# Patient Record
Sex: Female | Born: 1955
Health system: Southern US, Community
[De-identification: ages and names within clinical notes are randomized; demographics above are authoritative.]

## PROBLEM LIST (undated history)

## (undated) DIAGNOSIS — F419 Anxiety disorder, unspecified: Secondary | ICD-10-CM

## (undated) DIAGNOSIS — Z789 Other specified health status: Secondary | ICD-10-CM

## (undated) DIAGNOSIS — K7460 Unspecified cirrhosis of liver: Secondary | ICD-10-CM

## (undated) DIAGNOSIS — F1090 Alcohol use, unspecified, uncomplicated: Secondary | ICD-10-CM

## (undated) DIAGNOSIS — R413 Other amnesia: Secondary | ICD-10-CM

## (undated) DIAGNOSIS — F321 Major depressive disorder, single episode, moderate: Secondary | ICD-10-CM

## (undated) DIAGNOSIS — R202 Paresthesia of skin: Secondary | ICD-10-CM

## (undated) DIAGNOSIS — B182 Chronic viral hepatitis C: Secondary | ICD-10-CM

## (undated) HISTORY — DX: Chronic viral hepatitis C: B18.2

## (undated) HISTORY — DX: Other specified health status: Z78.9

## (undated) HISTORY — DX: Paresthesia of skin: R20.2

## (undated) HISTORY — DX: Other amnesia: R41.3

## (undated) HISTORY — DX: Major depressive disorder, single episode, moderate: F32.1

## (undated) HISTORY — DX: Alcohol use, unspecified, uncomplicated: F10.90

## (undated) HISTORY — DX: Unspecified cirrhosis of liver: K74.60

## (undated) HISTORY — DX: Anxiety disorder, unspecified: F41.9

---

## 2009-07-19 ENCOUNTER — Inpatient Hospital Stay (HOSPITAL_COMMUNITY): Admission: EM | Admit: 2009-07-19 | Discharge: 2009-07-20 | Payer: Self-pay | Admitting: Emergency Medicine

## 2010-09-09 LAB — COMPREHENSIVE METABOLIC PANEL
ALT: 31 U/L (ref 0–35)
AST: 23 U/L (ref 0–37)
Albumin: 3.4 g/dL — ABNORMAL LOW (ref 3.5–5.2)
Alkaline Phosphatase: 53 U/L (ref 39–117)
BUN: 7 mg/dL (ref 6–23)
CO2: 23 mEq/L (ref 19–32)
Calcium: 8.6 mg/dL (ref 8.4–10.5)
Chloride: 108 mEq/L (ref 96–112)
Creatinine, Ser: 0.6 mg/dL (ref 0.4–1.2)
GFR calc Af Amer: >60 mL/min (ref >60)
GFR calc non Af Amer: >60 mL/min (ref >60)
Glucose, Bld: 111 mg/dL — ABNORMAL HIGH (ref 70–99)
Potassium: 3.9 mEq/L (ref 3.5–5.1)
Sodium: 140 mEq/L (ref 135–145)
Total Bilirubin: 0.7 mg/dL (ref 0.3–1.2)
Total Protein: 6 g/dL (ref 6.0–8.3)

## 2010-09-09 LAB — CBC
HCT: 36.6 % (ref 36.0–46.0)
HCT: 40 % (ref 36.0–46.0)
Hemoglobin: 12.2 g/dL (ref 12.0–15.0)
Hemoglobin: 13.4 g/dL (ref 12.0–15.0)
MCHC: 33.4 g/dL (ref 30.0–36.0)
MCHC: 33.4 g/dL (ref 30.0–36.0)
MCV: 97.3 fL (ref 78.0–100.0)
MCV: 97.9 fL (ref 78.0–100.0)
Platelets: 137 10*3/uL — ABNORMAL LOW (ref 150–400)
Platelets: 163 10*3/uL (ref 150–400)
RBC: 3.74 MIL/uL — ABNORMAL LOW (ref 3.87–5.11)
RBC: 4.12 MIL/uL (ref 3.87–5.11)
RDW: 12 % (ref 11.5–15.5)
RDW: 12.2 % (ref 11.5–15.5)
WBC: 5.7 10*3/uL (ref 4.0–10.5)
WBC: 6.9 10*3/uL (ref 4.0–10.5)

## 2010-09-09 LAB — DIFFERENTIAL
Basophils Absolute: 0 10*3/uL (ref 0.0–0.1)
Basophils Relative: 0 % (ref 0–1)
Eosinophils Absolute: 0 10*3/uL (ref 0.0–0.7)
Eosinophils Relative: 0 % (ref 0–5)
Lymphocytes Relative: 14 % (ref 12–46)
Lymphs Abs: 0.8 10*3/uL (ref 0.7–4.0)
Monocytes Absolute: 0.1 10*3/uL (ref 0.1–1.0)
Monocytes Relative: 2 % — ABNORMAL LOW (ref 3–12)
Neutro Abs: 4.7 10*3/uL (ref 1.7–7.7)
Neutrophils Relative %: 84 % — ABNORMAL HIGH (ref 43–77)

## 2010-09-09 LAB — RPR: RPR Ser Ql: NONREACTIVE

## 2010-09-09 LAB — CSF CELL COUNT WITH DIFFERENTIAL
Lymphs, CSF: 70 % (ref 40–80)
Lymphs, CSF: 71 % (ref 40–80)
Monocyte-Macrophage-Spinal Fluid: 12 % — ABNORMAL LOW (ref 15–45)
Monocyte-Macrophage-Spinal Fluid: 6 % — ABNORMAL LOW (ref 15–45)
RBC Count, CSF: 49 /mm3 — ABNORMAL HIGH
RBC Count, CSF: 7 /mm3 — ABNORMAL HIGH
Segmented Neutrophils-CSF: 18 % — ABNORMAL HIGH (ref 0–6)
Segmented Neutrophils-CSF: 23 % — ABNORMAL HIGH (ref 0–6)
Tube #: 1
Tube #: 3
WBC, CSF: 34 /mm3 (ref 0–5)
WBC, CSF: 93 /mm3 (ref 0–5)

## 2010-09-09 LAB — GRAM STAIN

## 2010-09-09 LAB — CSF CULTURE W GRAM STAIN: Culture: NO GROWTH

## 2010-09-09 LAB — HIV ANTIBODY (ROUTINE TESTING W REFLEX): HIV: NONREACTIVE

## 2010-09-09 LAB — POCT I-STAT, CHEM 8
BUN: 7 mg/dL (ref 6–23)
Calcium, Ion: 0.99 mmol/L — ABNORMAL LOW (ref 1.12–1.32)
Chloride: 102 mEq/L (ref 96–112)
Creatinine, Ser: 0.8 mg/dL (ref 0.4–1.2)
Glucose, Bld: 105 mg/dL — ABNORMAL HIGH (ref 70–99)
HCT: 47 % — ABNORMAL HIGH (ref 36.0–46.0)
Hemoglobin: 16 g/dL — ABNORMAL HIGH (ref 12.0–15.0)
Potassium: 3.9 mEq/L (ref 3.5–5.1)
Sodium: 132 mEq/L — ABNORMAL LOW (ref 135–145)
TCO2: 21 mmol/L (ref 0–100)

## 2010-09-09 LAB — BETA-HYDROXYBUTYRIC ACID

## 2010-09-09 LAB — PROTEIN AND GLUCOSE, CSF
Glucose, CSF: 55 mg/dL (ref 43–76)
Total  Protein, CSF: 132 mg/dL — ABNORMAL HIGH (ref 15–45)

## 2010-09-09 LAB — GLUCOSE, CAPILLARY: Glucose-Capillary: 105 mg/dL — ABNORMAL HIGH (ref 70–99)

## 2010-09-09 LAB — PHOSPHORUS: Phosphorus: 4.5 mg/dL (ref 2.3–4.6)

## 2012-12-06 ENCOUNTER — Encounter: Payer: Self-pay | Admitting: Obstetrics

## 2012-12-06 ENCOUNTER — Ambulatory Visit (INDEPENDENT_AMBULATORY_CARE_PROVIDER_SITE_OTHER): Payer: No Typology Code available for payment source | Admitting: Obstetrics

## 2012-12-06 VITALS — BP 165/89 | HR 98 | Temp 98.7°F | Ht 67.0 in | Wt 137.0 lb

## 2012-12-06 DIAGNOSIS — Z01419 Encounter for gynecological examination (general) (routine) without abnormal findings: Secondary | ICD-10-CM

## 2012-12-06 DIAGNOSIS — N949 Unspecified condition associated with female genital organs and menstrual cycle: Secondary | ICD-10-CM | POA: Insufficient documentation

## 2012-12-06 LAB — DIFFERENTIAL
Basophils Absolute: 0 10*3/uL (ref 0.0–0.1)
Basophils Relative: 0 % (ref 0–1)
Eosinophils Absolute: 0.1 10*3/uL (ref 0.0–0.7)
Eosinophils Relative: 2 % (ref 0–5)
Lymphocytes Relative: 26 % (ref 12–46)
Lymphs Abs: 1.6 10*3/uL (ref 0.7–4.0)
Monocytes Absolute: 0.7 10*3/uL (ref 0.1–1.0)
Monocytes Relative: 12 % (ref 3–12)
Neutro Abs: 3.7 10*3/uL (ref 1.7–7.7)
Neutrophils Relative %: 60 % (ref 43–77)

## 2012-12-06 LAB — COMPREHENSIVE METABOLIC PANEL
ALT: 102 U/L — ABNORMAL HIGH (ref 0–35)
AST: 74 U/L — ABNORMAL HIGH (ref 0–37)
Albumin: 4.1 g/dL (ref 3.5–5.2)
Alkaline Phosphatase: 74 U/L (ref 39–117)
BUN: 4 mg/dL — ABNORMAL LOW (ref 6–23)
CO2: 25 mEq/L (ref 19–32)
Calcium: 9.1 mg/dL (ref 8.4–10.5)
Chloride: 103 mEq/L (ref 96–112)
Creat: 0.55 mg/dL (ref 0.50–1.10)
Glucose, Bld: 98 mg/dL (ref 70–99)
Potassium: 4 mEq/L (ref 3.5–5.3)
Sodium: 136 mEq/L (ref 135–145)
Total Bilirubin: 0.9 mg/dL (ref 0.3–1.2)
Total Protein: 6.8 g/dL (ref 6.0–8.3)

## 2012-12-06 LAB — CBC
HCT: 40 % (ref 36.0–46.0)
Hemoglobin: 14.5 g/dL (ref 12.0–15.0)
MCH: 33.6 pg (ref 26.0–34.0)
MCHC: 36.3 g/dL — ABNORMAL HIGH (ref 30.0–36.0)
MCV: 92.8 fL (ref 78.0–100.0)
Platelets: 171 10*3/uL (ref 150–400)
RBC: 4.31 MIL/uL (ref 3.87–5.11)
RDW: 13 % (ref 11.5–15.5)
WBC: 6.2 10*3/uL (ref 4.0–10.5)

## 2012-12-06 LAB — TSH: TSH: 2.843 u[IU]/mL (ref 0.350–4.500)

## 2012-12-06 NOTE — Addendum Note (Signed)
Addended by: Julaine Hua on: 12/06/2012 05:49 PM   Modules accepted: Orders

## 2012-12-06 NOTE — Patient Instructions (Addendum)
Causes of pelvic pain, and management.

## 2012-12-06 NOTE — Progress Notes (Signed)
Subjective:     Regina Smith is a 57 y.o. female here for a routine exam.  Current complaints: problem visit and annual exam. Pt states she started having pelvic pain about two days ago and it has moved around to her back.  Has had H/O constipation and did feel better after taking a laxative.  Pt states constant dull ache and pressure. Pt states she has been resting. Pt states she has tried aspirin and it did help to relieve the pain.   Personal health questionnaire reviewed: yes.  Has been postmenopausal for ~ 10 years.   Gynecologic History No LMP recorded. Patient is postmenopausal. Contraception: abstinence Last Pap: 2005. Results were: normal Last mammogram: n/a. Results were: n/a  Obstetric History OB History   Grav Para Term Preterm Abortions TAB SAB Ect Mult Living                   The following portions of the patient's history were reviewed and updated as appropriate: allergies, current medications, past family history, past medical history, past social history, past surgical history and problem list.  Review of Systems Pertinent items are noted in HPI.    Objective:    General appearance: alert and no distress Breasts: normal appearance, no masses or tenderness Abdomen: normal findings: soft, non-tender Pelvic: cervix normal in appearance, external genitalia normal, no cervical motion tenderness, positive findings: tenderness of L>R.  No masses. adnexa(e), uterus normal size, shape, and consistency and vagina normal without discharge    Assessment:    Healthy female exam.   Pelvic pain.  R/O IBS.   Plan:    Mammogram ordered. Follow up in: 2 weeks. Ultrasound ordered.

## 2012-12-07 ENCOUNTER — Ambulatory Visit (INDEPENDENT_AMBULATORY_CARE_PROVIDER_SITE_OTHER): Payer: No Typology Code available for payment source

## 2012-12-07 ENCOUNTER — Other Ambulatory Visit: Payer: Self-pay | Admitting: Obstetrics

## 2012-12-07 DIAGNOSIS — D259 Leiomyoma of uterus, unspecified: Secondary | ICD-10-CM

## 2012-12-07 DIAGNOSIS — N949 Unspecified condition associated with female genital organs and menstrual cycle: Secondary | ICD-10-CM

## 2012-12-07 DIAGNOSIS — N83209 Unspecified ovarian cyst, unspecified side: Secondary | ICD-10-CM

## 2012-12-08 LAB — PAP IG W/ RFLX HPV ASCU

## 2012-12-13 ENCOUNTER — Encounter: Payer: Self-pay | Admitting: Obstetrics

## 2012-12-21 ENCOUNTER — Ambulatory Visit (INDEPENDENT_AMBULATORY_CARE_PROVIDER_SITE_OTHER): Payer: No Typology Code available for payment source | Admitting: Obstetrics

## 2012-12-21 ENCOUNTER — Encounter: Payer: Self-pay | Admitting: Obstetrics

## 2012-12-21 ENCOUNTER — Encounter: Payer: Self-pay | Admitting: Gastroenterology

## 2012-12-21 VITALS — BP 144/88 | HR 75 | Temp 98.4°F | Ht 67.0 in | Wt 136.0 lb

## 2012-12-21 DIAGNOSIS — N949 Unspecified condition associated with female genital organs and menstrual cycle: Secondary | ICD-10-CM

## 2012-12-21 DIAGNOSIS — R7989 Other specified abnormal findings of blood chemistry: Secondary | ICD-10-CM

## 2012-12-21 NOTE — Progress Notes (Signed)
.   Subjective:     Regina Smith is a 57 y.o. female here for her results - she had an ultrasound done on 6.17/14 and blood work done at her last visit.  No current complaints.  Personal health questionnaire reviewed: yes.   Gynecologic History No LMP recorded. Patient is postmenopausal. Contraception: none  Obstetric History OB History   Grav Para Term Preterm Abortions TAB SAB Ect Mult Living   1 1        1      # Outc Date GA Lbr Len/2nd Wgt Sex Del Anes PTL Lv   1 PAR                The following portions of the patient's history were reviewed and updated as appropriate: allergies, current medications, past family history, past medical history, past social history, past surgical history and problem list.  Review of Systems Pertinent items are noted in HPI.    Objective:    No exam performed today, Consult only, for results..    Assessment:    Pelvic pain.  Resolved.  Ultrasound normal.  Elevated LFT's.   Plan:    Follow up in: several months. GI referral for elevated LFT's.

## 2012-12-27 ENCOUNTER — Ambulatory Visit: Payer: No Typology Code available for payment source | Admitting: Gastroenterology

## 2012-12-30 ENCOUNTER — Ambulatory Visit (HOSPITAL_COMMUNITY): Payer: No Typology Code available for payment source

## 2014-04-24 ENCOUNTER — Encounter: Payer: Self-pay | Admitting: Obstetrics

## 2015-06-14 DIAGNOSIS — R51 Headache: Secondary | ICD-10-CM

## 2015-06-14 DIAGNOSIS — R519 Headache, unspecified: Secondary | ICD-10-CM | POA: Insufficient documentation

## 2015-06-14 DIAGNOSIS — R059 Cough, unspecified: Secondary | ICD-10-CM | POA: Insufficient documentation

## 2015-06-14 DIAGNOSIS — G5600 Carpal tunnel syndrome, unspecified upper limb: Secondary | ICD-10-CM | POA: Insufficient documentation

## 2015-06-14 DIAGNOSIS — M62838 Other muscle spasm: Secondary | ICD-10-CM | POA: Insufficient documentation

## 2015-06-14 DIAGNOSIS — M4802 Spinal stenosis, cervical region: Secondary | ICD-10-CM | POA: Insufficient documentation

## 2015-06-14 DIAGNOSIS — F5101 Primary insomnia: Secondary | ICD-10-CM | POA: Insufficient documentation

## 2015-06-14 DIAGNOSIS — R5383 Other fatigue: Secondary | ICD-10-CM | POA: Insufficient documentation

## 2015-06-14 DIAGNOSIS — R05 Cough: Secondary | ICD-10-CM | POA: Insufficient documentation

## 2015-06-20 DIAGNOSIS — R7309 Other abnormal glucose: Secondary | ICD-10-CM | POA: Insufficient documentation

## 2015-07-09 DIAGNOSIS — F331 Major depressive disorder, recurrent, moderate: Secondary | ICD-10-CM | POA: Insufficient documentation

## 2018-05-31 DIAGNOSIS — L7211 Pilar cyst: Secondary | ICD-10-CM | POA: Diagnosis not present

## 2018-05-31 DIAGNOSIS — F329 Major depressive disorder, single episode, unspecified: Secondary | ICD-10-CM | POA: Diagnosis not present

## 2018-05-31 DIAGNOSIS — N390 Urinary tract infection, site not specified: Secondary | ICD-10-CM | POA: Diagnosis not present

## 2018-06-21 DIAGNOSIS — L7211 Pilar cyst: Secondary | ICD-10-CM | POA: Diagnosis not present

## 2018-08-18 ENCOUNTER — Encounter: Payer: Self-pay | Admitting: Obstetrics and Gynecology

## 2018-08-26 ENCOUNTER — Other Ambulatory Visit (HOSPITAL_COMMUNITY)
Admission: RE | Admit: 2018-08-26 | Discharge: 2018-08-26 | Disposition: A | Discharge status: Home / Self Care | Payer: BLUE CROSS/BLUE SHIELD | Source: Ambulatory Visit | Attending: Obstetrics and Gynecology | Admitting: Obstetrics and Gynecology

## 2018-08-26 ENCOUNTER — Other Ambulatory Visit: Payer: Self-pay

## 2018-08-26 ENCOUNTER — Ambulatory Visit (INDEPENDENT_AMBULATORY_CARE_PROVIDER_SITE_OTHER): Payer: BLUE CROSS/BLUE SHIELD | Admitting: Obstetrics and Gynecology

## 2018-08-26 ENCOUNTER — Encounter: Payer: Self-pay | Admitting: Obstetrics and Gynecology

## 2018-08-26 VITALS — BP 140/88 | HR 84 | Ht 65.75 in | Wt 127.4 lb

## 2018-08-26 DIAGNOSIS — R945 Abnormal results of liver function studies: Secondary | ICD-10-CM

## 2018-08-26 DIAGNOSIS — Z0000 Encounter for general adult medical examination without abnormal findings: Secondary | ICD-10-CM | POA: Diagnosis not present

## 2018-08-26 DIAGNOSIS — Z01419 Encounter for gynecological examination (general) (routine) without abnormal findings: Secondary | ICD-10-CM

## 2018-08-26 DIAGNOSIS — Z113 Encounter for screening for infections with a predominantly sexual mode of transmission: Secondary | ICD-10-CM | POA: Insufficient documentation

## 2018-08-26 DIAGNOSIS — E2839 Other primary ovarian failure: Secondary | ICD-10-CM

## 2018-08-26 DIAGNOSIS — R7989 Other specified abnormal findings of blood chemistry: Secondary | ICD-10-CM

## 2018-08-26 DIAGNOSIS — Z13820 Encounter for screening for osteoporosis: Secondary | ICD-10-CM

## 2018-08-26 DIAGNOSIS — N898 Other specified noninflammatory disorders of vagina: Secondary | ICD-10-CM | POA: Diagnosis not present

## 2018-08-26 DIAGNOSIS — Z124 Encounter for screening for malignant neoplasm of cervix: Secondary | ICD-10-CM

## 2018-08-26 DIAGNOSIS — F17200 Nicotine dependence, unspecified, uncomplicated: Secondary | ICD-10-CM

## 2018-08-26 NOTE — Patient Instructions (Signed)
Check on coverage for colonoscopy and the cologuard.  EXERCISE AND DIET:  We recommended that you start or continue a regular exercise program for good health. Regular exercise means any activity that makes your heart beat faster and makes you sweat.  We recommend exercising at least 30 minutes per day at least 3 days a week, preferably 4 or 5.  We also recommend a diet low in fat and sugar.  Inactivity, poor dietary choices and obesity can cause diabetes, heart attack, stroke, and kidney damage, among others.    ALCOHOL AND SMOKING:  Women should limit their alcohol intake to no more than 7 drinks/beers/glasses of wine (combined, not each!) per week. Moderation of alcohol intake to this level decreases your risk of breast cancer and liver damage. And of course, no recreational drugs are part of a healthy lifestyle.  And absolutely no smoking or even second hand smoke. Most people know smoking can cause heart and lung diseases, but did you know it also contributes to weakening of your bones? Aging of your skin?  Yellowing of your teeth and nails?  CALCIUM AND VITAMIN D:  Adequate intake of calcium and Vitamin D are recommended.  The recommendations for exact amounts of these supplements seem to change often, but generally speaking 1,200 mg of calcium (between diet and supplement) and 800 units of Vitamin D per day seems prudent. Certain women may benefit from higher intake of Vitamin D.  If you are among these women, your doctor will have told you during your visit.    PAP SMEARS:  Pap smears, to check for cervical cancer or precancers,  have traditionally been done yearly, although recent scientific advances have shown that most women can have pap smears less often.  However, every woman still should have a physical exam from her gynecologist every year. It will include a breast check, inspection of the vulva and vagina to check for abnormal growths or skin changes, a visual exam of the cervix, and then an  exam to evaluate the size and shape of the uterus and ovaries.  And after 63 years of age, a rectal exam is indicated to check for rectal cancers. We will also provide age appropriate advice regarding health maintenance, like when you should have certain vaccines, screening for sexually transmitted diseases, bone density testing, colonoscopy, mammograms, etc.   MAMMOGRAMS:  All women over 13 years old should have a yearly mammogram. Many facilities now offer a "3D" mammogram, which may cost around $50 extra out of pocket. If possible,  we recommend you accept the option to have the 3D mammogram performed.  It both reduces the number of women who will be called back for extra views which then turn out to be normal, and it is better than the routine mammogram at detecting truly abnormal areas.    COLON CANCER SCREENING: Now recommend starting at age 63. At this time colonoscopy is not covered for routine screening until 50. There are take home tests that can be done between 45-49.   COLONOSCOPY:  Colonoscopy to screen for colon cancer is recommended for all women at age 63.  We know, you hate the idea of the prep.  We agree, BUT, having colon cancer and not knowing it is worse!!  Colon cancer so often starts as a polyp that can be seen and removed at colonscopy, which can quite literally save your life!  And if your first colonoscopy is normal and you have no family history of colon cancer,  most women don't have to have it again for 10 years.  Once every ten years, you can do something that may end up saving your life, right?  We will be happy to help you get it scheduled when you are ready.  Be sure to check your insurance coverage so you understand how much it will cost.  It may be covered as a preventative service at no cost, but you should check your particular policy.      Breast Self-Awareness Breast self-awareness means being familiar with how your breasts look and feel. It involves checking your  breasts regularly and reporting any changes to your health care provider. Practicing breast self-awareness is important. A change in your breasts can be a sign of a serious medical problem. Being familiar with how your breasts look and feel allows you to find any problems early, when treatment is more likely to be successful. All women should practice breast self-awareness, including women who have had breast implants. How to do a breast self-exam One way to learn what is normal for your breasts and whether your breasts are changing is to do a breast self-exam. To do a breast self-exam: Look for Changes  1. Remove all the clothing above your waist. 2. Stand in front of a mirror in a room with good lighting. 3. Put your hands on your hips. 4. Push your hands firmly downward. 5. Compare your breasts in the mirror. Look for differences between them (asymmetry), such as: ? Differences in shape. ? Differences in size. ? Puckers, dips, and bumps in one breast and not the other. 6. Look at each breast for changes in your skin, such as: ? Redness. ? Scaly areas. 7. Look for changes in your nipples, such as: ? Discharge. ? Bleeding. ? Dimpling. ? Redness. ? A change in position. Feel for Changes Carefully feel your breasts for lumps and changes. It is best to do this while lying on your back on the floor and again while sitting or standing in the shower or tub with soapy water on your skin. Feel each breast in the following way:  Place the arm on the side of the breast you are examining above your head.  Feel your breast with the other hand.  Start in the nipple area and make  inch (2 cm) overlapping circles to feel your breast. Use the pads of your three middle fingers to do this. Apply light pressure, then medium pressure, then firm pressure. The light pressure will allow you to feel the tissue closest to the skin. The medium pressure will allow you to feel the tissue that is a little deeper.  The firm pressure will allow you to feel the tissue close to the ribs.  Continue the overlapping circles, moving downward over the breast until you feel your ribs below your breast.  Move one finger-width toward the center of the body. Continue to use the  inch (2 cm) overlapping circles to feel your breast as you move slowly up toward your collarbone.  Continue the up and down exam using all three pressures until you reach your armpit.  Write Down What You Find  Write down what is normal for each breast and any changes that you find. Keep a written record with breast changes or normal findings for each breast. By writing this information down, you do not need to depend only on memory for size, tenderness, or location. Write down where you are in your menstrual cycle, if you are  still menstruating. If you are having trouble noticing differences in your breasts, do not get discouraged. With time you will become more familiar with the variations in your breasts and more comfortable with the exam. How often should I examine my breasts? Examine your breasts every month. If you are breastfeeding, the best time to examine your breasts is after a feeding or after using a breast pump. If you menstruate, the best time to examine your breasts is 5-7 days after your period is over. During your period, your breasts are lumpier, and it may be more difficult to notice changes. When should I see my health care provider? See your health care provider if you notice:  A change in shape or size of your breasts or nipples.  A change in the skin of your breast or nipples, such as a reddened or scaly area.  Unusual discharge from your nipples.  A lump or thick area that was not there before.  Pain in your breasts.  Anything that concerns you.

## 2018-08-26 NOTE — Progress Notes (Signed)
63 y.o. G1P1 Single White or Caucasian Not Hispanic or Latino female here for annual exam.   She had sex in 1/19 after years without sex. Since then she has a vaginal odor. She has vaginal dryness. Not currently sexually active.  No vaginal bleeding. No bowel or bladder c/o.    No LMP recorded. Patient is postmenopausal.          Sexually active: No.  The current method of family planning is post menopausal status.    Exercising: Yes.    treadmill, floor exercises Smoker:  Yes, 5 cigarettes a day  Health Maintenance: Pap:  Unsure History of abnormal Pap:  Yes, cyrosurgery for dysplasia MMG:  Never BMD:   Never Colonoscopy: Never TDaP:  2009 Gardasil: N/A   reports that she has been smoking cigarettes. She has a 20.00 pack-year smoking history. She has never used smokeless tobacco. She reports current alcohol use of about 5.0 standard drinks of alcohol per week. She reports that she does not use drugs. 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 Grown daughter and 2 grandsons 5 and 12  History reviewed. No pertinent past medical history.  History reviewed. No pertinent surgical history.  Current Outpatient Medications  Medication Sig Dispense Refill  . buPROPion (WELLBUTRIN SR) 150 MG 12 hr tablet Take 150 mg by mouth 2 (two) times daily.    . TraZODone HCl 150 MG TB24 Take 1 tablet by mouth.     No current facility-administered medications for this visit.     Family History  Problem Relation Age of Onset  . Alzheimer's disease Mother   . Heart attack Father   Mom and sister with osteoporosis  Review of Systems  Constitutional: Negative.   HENT: Negative.   Eyes: Negative.   Respiratory: Negative.   Cardiovascular: Negative.   Gastrointestinal: Negative.   Endocrine: Negative.   Genitourinary: Positive for dyspareunia.       Vaginal odor  Musculoskeletal: Negative.   Skin: Negative.   Allergic/Immunologic: Negative.   Neurological: Negative.    Hematological: Negative.   Psychiatric/Behavioral: Negative.     Exam:   BP (!) 148/88 (BP Location: Right Arm, Patient Position: Sitting, Cuff Size: Normal)   Pulse 84   Ht 5' 5.75" (1.67 m)   Wt 127 lb 6.4 oz (57.8 kg)   BMI 20.72 kg/m   Weight change: @WEIGHTCHANGE @ Height:   Height: 5' 5.75" (167 cm)  Ht Readings from Last 3 Encounters:  08/26/18 5' 5.75" (1.67 m)  12/21/12 5\' 7"  (1.702 m)  12/06/12 5\' 7"  (1.702 m)    General appearance: alert, cooperative and appears stated age Head: Normocephalic, without obvious abnormality, atraumatic Neck: no adenopathy, supple, symmetrical, trachea midline and thyroid normal to inspection and palpation Lungs: clear to auscultation bilaterally Cardiovascular: regular rate and rhythm Breasts: normal appearance, no masses or tenderness Abdomen: soft, non-tender; non distended,  no masses,  liver edge is palpated 7 cm below liver edge Extremities: extremities normal, atraumatic, no cyanosis or edema Skin: Skin color, texture, turgor normal. No rashes or lesions Lymph nodes: Cervical, supraclavicular, and axillary nodes normal. No abnormal inguinal nodes palpated Neurologic: Grossly normal   Pelvic: External genitalia:  no lesions              Urethra:  normal appearing urethra with no masses, tenderness or lesions              Bartholins and Skenes: normal  Vagina: mildly atrophic appearing vagina with normal color and discharge, no lesions              Cervix: no lesions               Bimanual Exam:  Uterus:  normal size, contour, position, consistency, mobility, non-tender              Adnexa: no mass, fullness, tenderness               Rectovaginal: Confirms               Anus:  normal sphincter tone, no lesions  Chaperone was present for exam.  A:  Well Woman with normal exam  H/O elevated LFT's, thinks she tested + for hepatitis C  Elevated BP without diagnosis of HTN repeat 140/82  Vaginal odor  P:   Pap  with hpv, STD's  Screening labs  Screening hep b and c  STD screening  Affirm  Mammogram discussed, recommended, she will consider  Colonoscopy ordered  Discussed breast self exam  Discussed c alcium and vit D intake  F/U for bp check in a couple of weeks  DEXA (smoker, menopause)

## 2018-08-27 ENCOUNTER — Telehealth: Payer: Self-pay | Admitting: Emergency Medicine

## 2018-08-27 DIAGNOSIS — Z7689 Persons encountering health services in other specified circumstances: Secondary | ICD-10-CM

## 2018-08-27 LAB — LIPID PANEL
Chol/HDL Ratio: 2.4 ratio (ref 0.0–4.4)
Cholesterol, Total: 194 mg/dL (ref 100–199)
HDL: 80 mg/dL (ref >39)
LDL Calculated: 101 mg/dL — ABNORMAL HIGH (ref 0–99)
Triglycerides: 63 mg/dL (ref 0–149)
VLDL Cholesterol Cal: 13 mg/dL (ref 5–40)

## 2018-08-27 LAB — COMPREHENSIVE METABOLIC PANEL
ALT: 54 IU/L — ABNORMAL HIGH (ref 0–32)
AST: 55 IU/L — ABNORMAL HIGH (ref 0–40)
Albumin/Globulin Ratio: 1.8 (ref 1.2–2.2)
Albumin: 4.3 g/dL (ref 3.8–4.8)
Alkaline Phosphatase: 71 IU/L (ref 39–117)
BUN/Creatinine Ratio: 9 — ABNORMAL LOW (ref 12–28)
BUN: 5 mg/dL — ABNORMAL LOW (ref 8–27)
Bilirubin Total: 0.4 mg/dL (ref 0.0–1.2)
CO2: 21 mmol/L (ref 20–29)
Calcium: 9 mg/dL (ref 8.7–10.3)
Chloride: 104 mmol/L (ref 96–106)
Creatinine, Ser: 0.58 mg/dL (ref 0.57–1.00)
GFR calc Af Amer: 114 mL/min/{1.73_m2} (ref >59)
GFR calc non Af Amer: 99 mL/min/{1.73_m2} (ref >59)
Globulin, Total: 2.4 g/dL (ref 1.5–4.5)
Glucose: 97 mg/dL (ref 65–99)
Potassium: 3.9 mmol/L (ref 3.5–5.2)
Sodium: 140 mmol/L (ref 134–144)
Total Protein: 6.7 g/dL (ref 6.0–8.5)

## 2018-08-27 LAB — VAGINITIS/VAGINOSIS, DNA PROBE
Candida Species: NEGATIVE
Gardnerella vaginalis: NEGATIVE
Trichomonas vaginosis: NEGATIVE

## 2018-08-27 LAB — CBC
Hematocrit: 40.9 % (ref 34.0–46.6)
Hemoglobin: 14.1 g/dL (ref 11.1–15.9)
MCH: 32.1 pg (ref 26.6–33.0)
MCHC: 34.5 g/dL (ref 31.5–35.7)
MCV: 93 fL (ref 79–97)
Platelets: 117 10*3/uL — ABNORMAL LOW (ref 150–450)
RBC: 4.39 x10E6/uL (ref 3.77–5.28)
RDW: 12.7 % (ref 11.7–15.4)
WBC: 4.1 10*3/uL (ref 3.4–10.8)

## 2018-08-27 LAB — RPR: RPR Ser Ql: NONREACTIVE

## 2018-08-27 LAB — HEPATITIS C ANTIBODY: Hep C Virus Ab: >11 s/co ratio — ABNORMAL HIGH (ref 0.0–0.9)

## 2018-08-27 LAB — HEPATITIS B SURFACE ANTIGEN: Hepatitis B Surface Ag: NEGATIVE

## 2018-08-27 LAB — HIV ANTIBODY (ROUTINE TESTING W REFLEX): HIV Screen 4th Generation wRfx: NONREACTIVE

## 2018-08-27 NOTE — Telephone Encounter (Signed)
Patient returning call.

## 2018-08-27 NOTE — Telephone Encounter (Signed)
-----   Message from Salvadore Dom, MD sent at 08/27/2018 12:58 PM EST ----- Lorre Nick: Please add a HCV Nucleic Acid Amplification test to her blood work  Olivia Mackie: Please let the patient know that her Hep C AB test is + and we are adding further testing to see if she has an active Hep C infection, her LFT's are mildly elevated, her platelets are mildly low. If her follow up testing is + we need to send her to the hepatologist who specializes in Hep C. If not she needs a primary MD

## 2018-08-27 NOTE — Telephone Encounter (Signed)
Message left to return call to Quavon Keisling at [phone].    

## 2018-08-27 NOTE — Telephone Encounter (Signed)
Spoke with patient.  Message from Dr. Talbert Nan given.  Verbalizes understading.  Would like referral to PCP. Referral entered to [facility].  Will call back with final results and possible referral to Hep C clinic at [facility].

## 2018-08-31 LAB — CYTOLOGY - PAP
Chlamydia: NEGATIVE
Diagnosis: NEGATIVE
HPV: NOT DETECTED
Neisseria Gonorrhea: NEGATIVE

## 2018-09-02 LAB — HEPATITIS C VRS RNA DETECT BY PCR-QUAL: HCV RNA NAA Qualitative: POSITIVE — AB

## 2018-09-02 LAB — SPECIMEN STATUS REPORT

## 2018-09-03 ENCOUNTER — Telehealth: Payer: Self-pay | Admitting: Emergency Medicine

## 2018-09-03 DIAGNOSIS — R768 Other specified abnormal immunological findings in serum: Secondary | ICD-10-CM

## 2018-09-03 DIAGNOSIS — R7989 Other specified abnormal findings of blood chemistry: Secondary | ICD-10-CM

## 2018-09-03 DIAGNOSIS — R945 Abnormal results of liver function studies: Principal | ICD-10-CM

## 2018-09-03 NOTE — Telephone Encounter (Signed)
Notified patient of results and referral to HEP C clinic at [facility].  Questions answered and patient also has PCP appointment to establish care with Dr. Rogers Blocker.  Encounter closed.

## 2018-09-03 NOTE — Telephone Encounter (Signed)
-----   Message from Salvadore Dom, MD sent at 09/02/2018  5:14 PM EDT ----- Please inform and refer to hepatitis clinic

## 2018-09-17 ENCOUNTER — Ambulatory Visit: Payer: No Typology Code available for payment source | Admitting: Family Medicine

## 2018-10-19 ENCOUNTER — Encounter: Payer: No Typology Code available for payment source | Admitting: Infectious Diseases

## 2018-10-26 ENCOUNTER — Encounter: Payer: No Typology Code available for payment source | Admitting: Infectious Diseases

## 2018-11-26 ENCOUNTER — Encounter: Payer: No Typology Code available for payment source | Admitting: Infectious Diseases

## 2019-06-24 ENCOUNTER — Emergency Department (HOSPITAL_COMMUNITY)
Admission: EM | Admit: 2019-06-24 | Discharge: 2019-06-24 | Disposition: A | Discharge status: Home / Self Care | Payer: 59 | Attending: Emergency Medicine | Admitting: Emergency Medicine

## 2019-06-24 ENCOUNTER — Emergency Department (HOSPITAL_COMMUNITY): Payer: 59

## 2019-06-24 ENCOUNTER — Encounter (HOSPITAL_COMMUNITY): Payer: Self-pay | Admitting: Emergency Medicine

## 2019-06-24 DIAGNOSIS — F17210 Nicotine dependence, cigarettes, uncomplicated: Secondary | ICD-10-CM | POA: Diagnosis not present

## 2019-06-24 DIAGNOSIS — M79671 Pain in right foot: Secondary | ICD-10-CM | POA: Diagnosis present

## 2019-06-24 DIAGNOSIS — M84374A Stress fracture, right foot, initial encounter for fracture: Secondary | ICD-10-CM | POA: Insufficient documentation

## 2019-06-24 MED ORDER — HYDROCODONE-ACETAMINOPHEN 5-325 MG PO TABS
1.0000 | ORAL_TABLET | Freq: Once | ORAL | Status: AC
  Administered 2019-06-24: 1 via ORAL
  Filled 2019-06-24: qty 1

## 2019-06-24 MED ORDER — HYDROCODONE-ACETAMINOPHEN 5-325 MG PO TABS: 1.0000 | ORAL_TABLET | Freq: Three times a day (TID) | ORAL | 0 refills | Status: AC | PRN

## 2019-06-24 MED ORDER — ACETAMINOPHEN 500 MG PO TABS: 1000.0000 mg | ORAL_TABLET | Freq: Three times a day (TID) | ORAL | 0 refills | Status: AC
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
## 2019-06-27 ENCOUNTER — Ambulatory Visit (INDEPENDENT_AMBULATORY_CARE_PROVIDER_SITE_OTHER): Payer: 59 | Admitting: Orthopedic Surgery

## 2019-06-27 ENCOUNTER — Encounter: Payer: Self-pay | Admitting: Orthopedic Surgery

## 2019-06-27 ENCOUNTER — Other Ambulatory Visit: Payer: Self-pay

## 2019-06-27 VITALS — Ht 65.0 in | Wt 127.0 lb

## 2019-06-27 DIAGNOSIS — S92301A Fracture of unspecified metatarsal bone(s), right foot, initial encounter for closed fracture: Secondary | ICD-10-CM

## 2019-06-27 MED ORDER — HYDROCODONE-ACETAMINOPHEN 5-325 MG PO TABS: 1.0000 | ORAL_TABLET | Freq: Four times a day (QID) | ORAL | 0 refills | Status: DC | PRN
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
## 2019-06-28 ENCOUNTER — Telehealth: Payer: Self-pay | Admitting: Orthopedic Surgery

## 2019-06-28 ENCOUNTER — Other Ambulatory Visit: Payer: Self-pay

## 2019-06-28 NOTE — Telephone Encounter (Signed)
Patient called asked if she can be called when note has been faxed to her employer. The number to contact patient is [phone]

## 2019-06-28 NOTE — Telephone Encounter (Signed)
Patient called. The fax number to send note to is [place]

## 2019-06-28 NOTE — Telephone Encounter (Signed)
As per our conversation the last office note and letter will be faxed to the number provided.

## 2019-06-28 NOTE — Telephone Encounter (Signed)
Patient called. She would like a note for work stating that she is unable to drive for 3 or more weeks. She would like the letter sent to her email [email or web address]. 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 Her call back number is [phone]

## 2019-06-28 NOTE — Telephone Encounter (Signed)
I called pt and advised that I can fax the note for her and she states that she will call back with fax number. Pt also wanted a note stating that she could not work. I advised that I would fax over the dictation from her office visit that states that she will be wtbat in fx boot and no driving. Voiced understanding and will call back with fax number.

## 2019-06-29 NOTE — Telephone Encounter (Signed)
I called and sw pt and advised that her letter and last dictation was faxed per her request this morning. Pt is also being mailed a copy today.

## 2019-06-30 ENCOUNTER — Other Ambulatory Visit: Payer: Self-pay | Admitting: Orthopedic Surgery

## 2019-07-01 NOTE — Telephone Encounter (Signed)
Duda patient °

## 2019-07-01 NOTE — Telephone Encounter (Signed)
Too soon for refill.

## 2019-07-01 NOTE — Telephone Encounter (Signed)
You saw this pt Monday for MT neck fx. Requesting refill on Hydrocodone. Has had total 35 since 06/24/19 please advise.

## 2019-07-04 NOTE — Telephone Encounter (Signed)
You have to deny the refill the system will not let me I do not have clearance to do that.

## 2019-07-14 ENCOUNTER — Encounter: Payer: Self-pay | Admitting: Orthopedic Surgery

## 2019-07-14 ENCOUNTER — Ambulatory Visit (INDEPENDENT_AMBULATORY_CARE_PROVIDER_SITE_OTHER): Payer: 59 | Admitting: Orthopedic Surgery

## 2019-07-14 ENCOUNTER — Other Ambulatory Visit: Payer: Self-pay

## 2019-07-14 ENCOUNTER — Ambulatory Visit (INDEPENDENT_AMBULATORY_CARE_PROVIDER_SITE_OTHER): Payer: 59

## 2019-07-14 VITALS — Ht 65.0 in | Wt 127.0 lb

## 2019-07-14 DIAGNOSIS — S92301A Fracture of unspecified metatarsal bone(s), right foot, initial encounter for closed fracture: Secondary | ICD-10-CM | POA: Diagnosis not present

## 2019-07-14 NOTE — Progress Notes (Signed)
Office Visit Note   Patient: Regina Smith           Date of Birth: January 07, 1956           MRN: [id number] Visit Date: 07/14/2019              Requested by: No referring provider defined for this encounter. PCP: Patient, No Pcp Per  Chief Complaint  Patient presents with  . Right Foot - Follow-up    06/24/19 displaced 2nd, 3rd, 4th MT      HPI: Patient is a 64 year old woman who presents in follow-up for metatarsal neck fractures right foot second third and fourth metatarsal she is currently weightbearing in a regular sneaker she states she feels better she feels a little sore has no pain with driving.  Assessment & Plan: Visit Diagnoses:  1. Closed fracture of head of metatarsal, right, initial encounter     Plan: Patient is given a note that she may return to work Saturday, 07/16/2019.  Increase her activities as tolerated no restrictions.  Recommended Achilles stretching to unload pressure from the forefoot.  Follow-Up Instructions: Return if symptoms worsen or fail to improve.   Ortho Exam  Patient is alert, oriented, no adenopathy, well-dressed, normal affect, normal respiratory effort. Examination patient has some resolving ecchymosis and bruising.  She does have Achilles tightness with dorsiflexion only to neutral.  The metatarsal heads are not tender to palpation.  She has dorsiflexion to neutral with her knee extended.  Imaging: XR Foot Complete Right  Result Date: 07/14/2019 Three-view radiographs of the right foot shows interval healing of the metatarsal neck fractures 2 3 and 4.  No images are attached to the encounter.  Labs: Lab Results  Component Value Date   REPTSTATUS 07/22/2009 FINAL 07/19/2009   REPTSTATUS 07/19/2009 FINAL 07/19/2009   GRAMSTAIN  07/19/2009    CYTOSPIN WBC PRESENT, PREDOMINANTLY MONONUCLEAR NO ORGANISMS SEEN Gram Stain Report Called to,Read Back By and Verified With: Gram Stain Report Called to,Read Back By and Verified With:  GEIPLE J PA @ 1335 ON 07/19/09 BY HOBBINS J   GRAMSTAIN  07/19/2009    NO ORGANISMS SEEN WBC PRESENT, PREDOMINANTLY MONONUCLEAR Gram Stain Report Called to,Read Back By and Verified With: GEIPLE, J./PA AT 1335 ON 07/19/09 BY HOBBINS, J.   CULT NO GROWTH 3 DAYS 07/19/2009     Lab Results  Component Value Date   ALBUMIN 4.3 08/26/2018   ALBUMIN 4.1 12/06/2012   ALBUMIN 3.4 (L) 07/20/2009    No results found for: MG No results found for: VD25OH  No results found for: PREALBUMIN CBC EXTENDED Latest Ref Rng & Units 08/26/2018 12/06/2012 07/20/2009  WBC 3.4 - 10.8 x10E3/uL 4.1 6.2 6.9  RBC 3.77 - 5.28 x10E6/uL 4.39 4.31 3.74(L)  HGB 11.1 - 15.9 g/dL 14.1 14.5 12.2  HCT 34.0 - 46.6 % 40.9 40.0 36.6  PLT 150 - 450 x10E3/uL 117(L) 171 137(L)  NEUTROABS 1.7 - 7.7 K/uL - 3.7 -  LYMPHSABS 0.7 - 4.0 K/uL - 1.6 -     Body mass index is 21.13 kg/m.  Orders:  Orders Placed This Encounter  Procedures  . XR Foot Complete Right   No orders of the defined types were placed in this encounter.    Procedures: No procedures performed  Clinical Data: No additional findings.  ROS:  All other systems negative, except as noted in the HPI. Review of Systems  Objective: Vital Signs: Ht 5\' 5"  (1.651 m)   Wt 127 lb (57.6  kg)   BMI 21.13 kg/m   Specialty Comments:  No specialty comments available.  PMFS History: Patient Active Problem List   Diagnosis Date Noted  . Moderate episode of recurrent major depressive disorder ([place]) 07/09/2015  . Elevated glucose 06/20/2015  . Cervical stenosis of spine 06/14/2015  . Cough 06/14/2015  . Carpal tunnel syndrome 06/14/2015  . Fatigue 06/14/2015  . Frequent headaches 06/14/2015  . Muscle spasticity 06/14/2015  . Primary insomnia 06/14/2015  . LFT elevation 12/21/2012  . Unspecified symptom associated with female genital organs 12/06/2012   History reviewed. No pertinent past medical history.  Family History  Problem Relation Age of Onset   . Alzheimer's disease Mother   . Heart attack Father     History reviewed. No pertinent surgical history. Social History   Occupational History  . Not on file  Tobacco Use  . Smoking status: Current Every Day Smoker    Packs/day: 0.50    Years: 40.00    Pack years: 20.00    Types: Cigarettes  . Smokeless tobacco: Never Used  Substance and Sexual Activity  . Alcohol use: Yes    Alcohol/week: 5.0 standard drinks    Types: 5 Glasses of wine per week  . Drug use: No  . Sexual activity: Not Currently    Birth control/protection: Abstinence, Post-menopausal

## 2019-07-18 ENCOUNTER — Ambulatory Visit: Payer: No Typology Code available for payment source | Admitting: Orthopedic Surgery

## 2019-11-25 ENCOUNTER — Other Ambulatory Visit: Payer: Self-pay

## 2019-11-25 ENCOUNTER — Ambulatory Visit (INDEPENDENT_AMBULATORY_CARE_PROVIDER_SITE_OTHER): Payer: 59

## 2019-11-25 ENCOUNTER — Encounter: Payer: Self-pay | Admitting: Family

## 2019-11-25 ENCOUNTER — Ambulatory Visit (INDEPENDENT_AMBULATORY_CARE_PROVIDER_SITE_OTHER): Payer: 59 | Admitting: Family

## 2019-11-25 VITALS — Ht 65.0 in | Wt 127.0 lb

## 2019-11-25 DIAGNOSIS — S92301A Fracture of unspecified metatarsal bone(s), right foot, initial encounter for closed fracture: Secondary | ICD-10-CM

## 2019-11-25 DIAGNOSIS — M7741 Metatarsalgia, right foot: Secondary | ICD-10-CM | POA: Diagnosis not present

## 2019-11-30 ENCOUNTER — Encounter: Payer: Self-pay | Admitting: Family

## 2019-11-30 NOTE — Progress Notes (Signed)
Office Visit Note   Patient: Regina Smith           Date of Birth: 10/20/1955           MRN: [id number] Visit Date: 11/25/2019              Requested by: No referring provider defined for this encounter. PCP: Patient, No Pcp Per  Chief Complaint  Patient presents with  . Right Foot - Follow-up     06/24/19 displaced 2nd, 3rd, 4th MT        HPI: The patient is a 64 year old woman who presents today complaining of pain to her right foot.  She is status post displaced fractures of the second third and fourth metatarsals January 1 of this year.  She has been having some numbness to the metatarsal heads at night pain to the middle of her foot she is states that sometimes in shoe wear she feels like there is something moving in her foot it feels like a worm like a rock in her shoe.  Wonders if she has issues with nerve pain.  This is intermittent primarily with weightbearing has not had any recent injury swelling or bruising  Assessment & Plan: Visit Diagnoses:  1. Closed fracture of head of metatarsal, right, initial encounter     Plan: Reassurance provided radiographs clear.  She is comfortable following up in the office as needed.  We did discuss heel cord stretching to offload the metatarsal heads  Follow-Up Instructions: No follow-ups on file.   Ortho Exam  Patient is alert, oriented, no adenopathy, well-dressed, normal affect, normal respiratory effort. On examination of the foot toes are straight there is no obvious deformity no dystrophic changes no erythema metatarsals are largely nontender.  There is heel cord tightness with dorsiflexion to neutral  Imaging: No results found. No images are attached to the encounter.  Labs: Lab Results  Component Value Date   REPTSTATUS 07/22/2009 FINAL 07/19/2009   REPTSTATUS 07/19/2009 FINAL 07/19/2009   GRAMSTAIN  07/19/2009    CYTOSPIN WBC PRESENT, PREDOMINANTLY MONONUCLEAR NO ORGANISMS SEEN Gram Stain Report Called to,Read  Back By and Verified With: Gram Stain Report Called to,Read Back By and Verified With: GEIPLE J PA @ 1335 ON 07/19/09 BY HOBBINS J   GRAMSTAIN  07/19/2009    NO ORGANISMS SEEN WBC PRESENT, PREDOMINANTLY MONONUCLEAR Gram Stain Report Called to,Read Back By and Verified With: GEIPLE, J./PA AT 1335 ON 07/19/09 BY HOBBINS, J.   CULT NO GROWTH 3 DAYS 07/19/2009     Lab Results  Component Value Date   ALBUMIN 4.3 08/26/2018   ALBUMIN 4.1 12/06/2012   ALBUMIN 3.4 (L) 07/20/2009    No results found for: MG No results found for: VD25OH  No results found for: PREALBUMIN CBC EXTENDED Latest Ref Rng & Units 08/26/2018 12/06/2012 07/20/2009  WBC 3.4 - 10.8 x10E3/uL 4.1 6.2 6.9  RBC 3.77 - 5.28 x10E6/uL 4.39 4.31 3.74(L)  HGB 11.1 - 15.9 g/dL 14.1 14.5 12.2  HCT 34.0 - 46.6 % 40.9 40.0 36.6  PLT 150 - 450 x10E3/uL 117(L) 171 137(L)  NEUTROABS 1.7 - 7.7 K/uL - 3.7 -  LYMPHSABS 0.7 - 4.0 K/uL - 1.6 -     Body mass index is 21.13 kg/m.  Orders:  Orders Placed This Encounter  Procedures  . XR Foot 2 Views Right   No orders of the defined types were placed in this encounter.    Procedures: No procedures performed  Clinical Data: No additional  findings.  ROS:  All other systems negative, except as noted in the HPI. Review of Systems  Objective: Vital Signs: Ht 5\' 5"  (1.651 m)   Wt 127 lb (57.6 kg)   BMI 21.13 kg/m   Specialty Comments:  No specialty comments available.  PMFS History: Patient Active Problem List   Diagnosis Date Noted  . Moderate episode of recurrent major depressive disorder ([place]) 07/09/2015  . Elevated glucose 06/20/2015  . Cervical stenosis of spine 06/14/2015  . Cough 06/14/2015  . Carpal tunnel syndrome 06/14/2015  . Fatigue 06/14/2015  . Frequent headaches 06/14/2015  . Muscle spasticity 06/14/2015  . Primary insomnia 06/14/2015  . LFT elevation 12/21/2012  . Unspecified symptom associated with female genital organs 12/06/2012   History  reviewed. No pertinent past medical history.  Family History  Problem Relation Age of Onset  . Alzheimer's disease Mother   . Heart attack Father     History reviewed. No pertinent surgical history. Social History   Occupational History  . Not on file  Tobacco Use  . Smoking status: Current Every Day Smoker    Packs/day: 0.50    Years: 40.00    Pack years: 20.00    Types: Cigarettes  . Smokeless tobacco: Never Used  Substance and Sexual Activity  . Alcohol use: Yes    Alcohol/week: 5.0 standard drinks    Types: 5 Glasses of wine per week  . Drug use: No  . Sexual activity: Not Currently    Birth control/protection: Abstinence, Post-menopausal

## 2020-02-23 ENCOUNTER — Ambulatory Visit: Payer: 59 | Admitting: Family Medicine

## 2020-05-19 ENCOUNTER — Other Ambulatory Visit: Payer: Self-pay

## 2020-05-19 ENCOUNTER — Ambulatory Visit
Admission: RE | Admit: 2020-05-19 | Discharge: 2020-05-19 | Disposition: A | Discharge status: Home / Self Care | Payer: 59 | Source: Ambulatory Visit | Attending: Emergency Medicine | Admitting: Emergency Medicine

## 2020-05-19 VITALS — BP 132/77 | HR 80 | Temp 97.9°F | Resp 16

## 2020-05-19 DIAGNOSIS — S299XXA Unspecified injury of thorax, initial encounter: Secondary | ICD-10-CM

## 2020-05-19 MED ORDER — NAPROXEN 500 MG PO TABS: 500.0000 mg | ORAL_TABLET | Freq: Two times a day (BID) | ORAL | 0 refills | Status: DC

## 2020-05-19 MED ORDER — CYCLOBENZAPRINE HCL 5 MG PO TABS: 5.0000 mg | ORAL_TABLET | Freq: Two times a day (BID) | ORAL | 0 refills | Status: AC | PRN

## 2020-05-19 NOTE — ED Triage Notes (Signed)
Pt reports tripping and falling 9 days ago, hitting right lateral ribcage on corner of table.  C/O continued pain.  Has been using Ace wrap to ribcage.  Denies SOB.

## 2020-05-19 NOTE — ED Provider Notes (Signed)
[facility]    CSN: [id number] Arrival date & time: 05/19/20  1243      History   Chief Complaint Chief Complaint  Patient presents with  . Fall    HPI Regina Smith is a 64 y.o. female  Resenting for right rib pain s/p falling into table 9 days ago.  Denies head trauma, LOC.  No chest pain, difficulty breathing.  Has used Ace wrap with some relief.  Not taking any medications or topical products for this.  Concerned that at work she does a lot of lifting, twisting.  Low concern for fracture at this time: Denies history of osteoporosis.  History reviewed. No pertinent past medical history.  Patient Active Problem List   Diagnosis Date Noted  . Moderate episode of recurrent major depressive disorder ([place]) 07/09/2015  . Elevated glucose 06/20/2015  . Cervical stenosis of spine 06/14/2015  . Cough 06/14/2015  . Carpal tunnel syndrome 06/14/2015  . Fatigue 06/14/2015  . Frequent headaches 06/14/2015  . Muscle spasticity 06/14/2015  . Primary insomnia 06/14/2015  . LFT elevation 12/21/2012  . Unspecified symptom associated with female genital organs 12/06/2012    History reviewed. No pertinent surgical history.  OB History    Gravida  1   Para  1   Term      Preterm      AB      Living  1     SAB      TAB      Ectopic      Multiple      Live Births  1            Home Medications    Prior to Admission medications   Medication Sig Start Date End Date Taking? Authorizing Provider  TraZODone HCl 150 MG TB24 Take 1 tablet by mouth.   Yes [provider]  cyclobenzaprine (FLEXERIL) 5 MG tablet Take 1 tablet (5 mg total) by mouth 2 (two) times daily as needed for up to 7 days for muscle spasms. 05/19/20 05/26/20  Hall-Potvin, [place], PA-C  naproxen (NAPROSYN) 500 MG tablet Take 1 tablet (500 mg total) by mouth 2 (two) times daily. 05/19/20   Hall-Potvin, [place], PA-C  buPROPion (WELLBUTRIN SR) 150 MG 12 hr tablet Take 150  mg by mouth 2 (two) times daily.  05/19/20  [provider]    Family History Family History  Problem Relation Age of Onset  . Alzheimer's disease Mother   . Heart attack Father     Social History Social History   Tobacco Use  . Smoking status: Current Every Day Smoker    Packs/day: 0.50    Years: 40.00    Pack years: 20.00    Types: Cigarettes  . Smokeless tobacco: Never Used  Vaping Use  . Vaping Use: Never used  Substance Use Topics  . Alcohol use: Yes    Alcohol/week: 5.0 standard drinks    Types: 5 Glasses of wine per week  . Drug use: No     Allergies   Patient has no known allergies.   Review of Systems Review of Systems  Constitutional: Negative for fatigue and fever.  HENT: Negative for ear pain, sinus pain, sore throat and voice change.   Eyes: Negative for pain, redness and visual disturbance.  Respiratory: Negative for cough and shortness of breath.   Cardiovascular: Negative for chest pain and palpitations.  Gastrointestinal: Negative for abdominal pain, diarrhea and vomiting.  Musculoskeletal: Negative for arthralgias and myalgias.  Skin: Negative for rash and wound.  Neurological: Negative for syncope and headaches.     Physical Exam Triage Vital Signs ED Triage Vitals  Enc Vitals Group     BP 05/19/20 1309 132/77     Pulse Rate 05/19/20 1309 80     Resp 05/19/20 1309 16     Temp 05/19/20 1309 97.9 F (36.6 C)     Temp Source 05/19/20 1309 Oral     SpO2 05/19/20 1309 98 %     Weight --      Height --      Head Circumference --      Peak Flow --      Pain Score 05/19/20 1310 7     Pain Loc --      Pain Edu? --      Excl. in [place]? --    No data found.  Updated Vital Signs BP 132/77   Pulse 80   Temp 97.9 F (36.6 C) (Oral)   Resp 16   SpO2 98%   Visual Acuity Right Eye Distance:   Left Eye Distance:   Bilateral Distance:    Right Eye Near:   Left Eye Near:    Bilateral Near:     Physical Exam Constitutional:       General: She is not in acute distress. HENT:     Head: Normocephalic and atraumatic.  Eyes:     General: No scleral icterus.    Pupils: Pupils are equal, round, and reactive to light.  Cardiovascular:     Rate and Rhythm: Normal rate.  Pulmonary:     Effort: Pulmonary effort is normal.  Chest:    Skin:    Coloration: Skin is not jaundiced or pale.  Neurological:     Mental Status: She is alert and oriented to person, place, and time.      UC Treatments / Results  Labs (all labs ordered are listed, but only abnormal results are displayed) Labs Reviewed - No data to display  EKG   Radiology No results found.  Procedures Procedures (including critical care time)  Medications Ordered in UC Medications - No data to display  Initial Impression / Assessment and Plan / UC Course  I have reviewed the triage vital signs and the nursing notes.  Pertinent labs & imaging results that were available during my care of the patient were reviewed by me and considered in my medical decision making (see chart for details).     Exam reassuring at this time: Low concern for fracture.  Patient declining x-ray at this time given low utility.  Will treat supportively as below.  Return precautions discussed, pt verbalized understanding and is agreeable to plan. Final Clinical Impressions(s) / UC Diagnoses   Final diagnoses:  Rib injury     Discharge Instructions     Heat therapy (hot compress, warm wash rag, hot showers, etc.) can help relax muscles and soothe muscle aches. Cold therapy (ice packs) can be used to help swelling both after injury and after prolonged use of areas of chronic pain/aches.  Pain medication: 500 mg Naprosyn/Aleve (naproxen) every 12 hours with food:  AVOID other NSAIDs while taking this (may have Tylenol).  May take muscle relaxer as needed for severe pain / spasm.  (This medication may cause you to become tired so it is important you do not drink  alcohol or operate heavy machinery while on this medication.  Recommend your first dose to be taken before bedtime to  monitor for side effects safely)  Important to follow up with specialist(s) below for further evaluation/management if your symptoms persist or worsen.    ED Prescriptions    Medication Sig Dispense Auth. Provider   naproxen (NAPROSYN) 500 MG tablet Take 1 tablet (500 mg total) by mouth 2 (two) times daily. 30 tablet Hall-Potvin, [place], PA-C   cyclobenzaprine (FLEXERIL) 5 MG tablet Take 1 tablet (5 mg total) by mouth 2 (two) times daily as needed for up to 7 days for muscle spasms. 14 tablet Hall-Potvin, [place], PA-C     I have reviewed the PDMP during this encounter.   Hall-Potvin, [place], [place] 05/19/20 1347

## 2020-05-19 NOTE — Discharge Instructions (Signed)

## 2020-05-24 ENCOUNTER — Ambulatory Visit (INDEPENDENT_AMBULATORY_CARE_PROVIDER_SITE_OTHER): Payer: 59 | Admitting: Family Medicine

## 2020-05-24 ENCOUNTER — Other Ambulatory Visit: Payer: Self-pay

## 2020-05-24 ENCOUNTER — Encounter: Payer: Self-pay | Admitting: Family Medicine

## 2020-05-24 VITALS — BP 113/73 | HR 76 | Temp 97.9°F | Ht 65.0 in | Wt 111.2 lb

## 2020-05-24 DIAGNOSIS — Z23 Encounter for immunization: Secondary | ICD-10-CM | POA: Diagnosis not present

## 2020-05-24 DIAGNOSIS — F4321 Adjustment disorder with depressed mood: Secondary | ICD-10-CM

## 2020-05-24 DIAGNOSIS — F331 Major depressive disorder, recurrent, moderate: Secondary | ICD-10-CM | POA: Diagnosis not present

## 2020-05-24 DIAGNOSIS — F419 Anxiety disorder, unspecified: Secondary | ICD-10-CM | POA: Diagnosis not present

## 2020-05-24 DIAGNOSIS — B182 Chronic viral hepatitis C: Secondary | ICD-10-CM | POA: Diagnosis not present

## 2020-05-24 DIAGNOSIS — K739 Chronic hepatitis, unspecified: Secondary | ICD-10-CM | POA: Insufficient documentation

## 2020-05-24 MED ORDER — CLONAZEPAM 0.5 MG PO TABS: 0.5000 mg | ORAL_TABLET | Freq: Two times a day (BID) | ORAL | 1 refills | Status: DC | PRN

## 2020-05-24 MED ORDER — BUPROPION HCL ER (XL) 150 MG PO TB24: 150.0000 mg | ORAL_TABLET | Freq: Every day | ORAL | 1 refills | Status: DC

## 2020-05-24 MED ORDER — TRAZODONE HCL 100 MG PO TABS: 100.0000 mg | ORAL_TABLET | Freq: Every day | ORAL | 1 refills | Status: DC

## 2020-05-24 NOTE — Patient Instructions (Signed)
-  referral to [facility] for the Hep C. They will call you with this. Routine labs for this. Will need more immunizations for this!   -starting wellbutrin for depression. Please watch your anxiety. Keep up the counseling. Any suicide thoughts go to ER.   -klonopin PRN for anxiety, please try only to use with anxious feelings.   See you back in one month!  Nice to meet you!  Dr. Rogers Blocker

## 2020-05-24 NOTE — Progress Notes (Signed)
Patient: Regina Smith MRN: [id number] DOB: 06/15/1956 PCP: Orma Flaming, MD     Subjective:  Chief Complaint  Patient presents with  . Establish Care  . Depression  . Anxiety  . Hepatitis C    HPI: The patient is a 64 y.o. female who presents today for establishing care and for concerns of depression/anxiety. Also has history of hep C after reviewing chart further.   She just lost her daughter back in 12/20/22 from a drug overdose (assualt). She has had depression her whole life and used to be on wellbutrin and did well with this. She wants to go back on this. She has been on no other anti depression drugs. +FH of possible bipolar in her mother, but she was never diagnosed. Her younger sister has had some depression, but her daughter was murdered as well. She does talk to her sister nightly and this is a great source of comfort and support. She has also started counseling.   Anxiety She has always had social anxiety, but never was on medication. She is anxious now about her job and not making mistakes. This seems to be an issue since her daughter passed away in 2022/12/20. She has no panic attacks. She was given a few valium by previous physician. She can not take hydroxyzine.   Chronic Hep C Diagnosed in 2020. She never follow up with GI. Has had elevated liver enzymes, but no other symptoms per patient. She couldn't afford the treatment. She is not currently sexually active. She thinks she may have contracted this years ago when she did IV drugs. Needs lots of immunizations. Has had covid vaccines, declines flu shot. Will get tdap. Discussed pneumonia/hepatitis vaccines. Has never had a cscope either. She has lost quite a bit of weight that she correlates with grieving. Drinks about 2-3 glasses of wine/night.   Needs a tdap shot.   Review of Systems  Constitutional: Positive for unexpected weight change. Negative for chills, fatigue and fever.  HENT: Negative for congestion, dental  problem, ear pain, hearing loss, sore throat and trouble swallowing.   Eyes: Negative for visual disturbance.  Respiratory: Negative for cough, chest tightness, shortness of breath and wheezing.   Cardiovascular: Negative for chest pain, palpitations and leg swelling.  Gastrointestinal: Negative for abdominal pain, blood in stool, diarrhea and nausea.  Endocrine: Negative for cold intolerance, polydipsia, polyphagia and polyuria.  Genitourinary: Negative for dysuria and hematuria.  Musculoskeletal: Negative for arthralgias.  Skin: Negative for rash.  Neurological: Negative for dizziness and headaches.  Hematological: Does not bruise/bleed easily.  Psychiatric/Behavioral: Positive for dysphoric mood. Negative for sleep disturbance and suicidal ideas. The patient is not nervous/anxious.     Allergies Patient has No Known Allergies.  Past Medical History Patient  has no past medical history on file.  Surgical History Patient  has no past surgical history on file.  Family History Pateint's family history includes Alzheimer's disease in her mother; Heart attack in her father.  Social History Patient  reports that she has been smoking cigarettes. She has a 20.00 pack-year smoking history. She has never used smokeless tobacco. She reports current alcohol use of about 5.0 standard drinks of alcohol per week. She reports that she does not use drugs.    Objective: Vitals:   05/24/20 0849  BP: 113/73  Pulse: 76  Temp: 97.9 F (36.6 C)  TempSrc: Temporal  SpO2: 97%  Weight: 111 lb 3.2 oz (50.4 kg)  Height: 5\' 5"  (1.651 m)    Body  mass index is 18.5 kg/m.  Physical Exam Vitals reviewed.  Constitutional:      General: She is not in acute distress.    Appearance: Normal appearance. She is not ill-appearing.     Comments: Thin  HENT:     Head: Normocephalic and atraumatic.     Right Ear: Tympanic membrane, ear canal and external ear normal.     Left Ear: Tympanic membrane, ear  canal and external ear normal.  Eyes:     Extraocular Movements: Extraocular movements intact.     Pupils: Pupils are equal, round, and reactive to light.  Cardiovascular:     Rate and Rhythm: Normal rate and regular rhythm.     Heart sounds: Normal heart sounds.  Pulmonary:     Effort: Pulmonary effort is normal.     Breath sounds: Normal breath sounds.  Abdominal:     General: Bowel sounds are normal. There is no distension.     Palpations: Abdomen is soft.     Tenderness: There is no abdominal tenderness.     Comments: Hepatomegaly   Musculoskeletal:     Cervical back: Normal range of motion and neck supple.     Right lower leg: No edema.     Left lower leg: No edema.  Skin:    General: Skin is warm.     Coloration: Skin is not jaundiced.     Findings: No bruising.     Comments: Palmar erythema  Neurological:     General: No focal deficit present.     Mental Status: She is alert and oriented to person, place, and time.  Psychiatric:        Mood and Affect: Mood normal.        Behavior: Behavior normal.     Comments: Tearful at times talking about her daughter       Office Visit from 05/24/2020 in [facility]  PHQ-9 Total Score 9     GAD 7 : Generalized Anxiety Score 05/24/2020  Nervous, Anxious, on Edge 2  Control/stop worrying 3  Worry too much - different things 3  Trouble relaxing 2  Restless 1  Easily annoyed or irritable 1  Afraid - awful might happen 0  Total GAD 7 Score 12  Anxiety Difficulty Somewhat difficult         Assessment/plan: 1. Chronic hepatitis C without hepatic coma ([place]) Discussed importance of getting this treated and risk of [place]. Will get baseline labs including cbc, INR, CMP and AFP. Referral to GI. Ultrasound if needed as well. Really encouraged her to stop drinking, avoid tylenol. No easy bruising or bleeding but will f/u on INR/platelets. She is not currently sexually active. Also will need immunizations, but  she declines these today.  - Ambulatory referral to Gastroenterology - CBC with Differential/Platelet; Future - Lipid panel; Future - Protime-INR; Future - COMPLETE METABOLIC PANEL WITH GFR; Future - AFP tumor marker; Future - AFP tumor marker - COMPLETE METABOLIC PANEL WITH GFR - Protime-INR - Lipid panel - CBC with Differential/Platelet  2. Moderate episode of recurrent major depressive disorder (H[facility]) phq9 score is mild. Has been on wellbutrin in the past and would like to go back on this. Discussed that this could make anxiety worse, but she would still like to do this since did well on it before. No hx of seizures. Will do low dose. Side effects discussed. Will need to watch her weight as she is already quite thin. Any si/hi she is to  go ER. F/u with me in one month.  - TSH; Future - VITAMIN D 25 Hydroxy (Vit-D Deficiency, Fractures); Future - VITAMIN D 25 Hydroxy (Vit-D Deficiency, Fractures) - TSH  3. Anxiety GAD7 is moderate and I feel like a lot of this is more due to her grieving and trauma of the death of her daughter. Discussed medication for anxiety, but we are going to try wellbutrin first and then she has klonopin prn for severe anxiety. If I find she is needing klonopin regularly discussed I may change her to a SSRI over wellbutrin. F/u in one month.   4. Grieving She is going through a lot and discussed natural course of grieving. Already in counseling. Continue this. Will follow closely.   5. Need for Tdap vaccination  - Tdap vaccine greater than or equal to 7yo IM  Total time of encounter: 60 minutes total time of encounter, including 45 minutes spent in face-to-face patient care. This time includes coordination of care and counseling regarding above mentioned conditions/work up/plan and treatment. Remainder of non-face-to-face time involved reviewing chart documents/testing relevant to the patient encounter and documentation in the medical record.  This visit  occurred during the SARS-CoV-2 public health emergency.  Safety protocols were in place, including screening questions prior to the visit, additional usage of staff PPE, and extensive cleaning of exam room while observing appropriate contact time as indicated for disinfecting solutions.     Return in about 1 month (around 06/24/2020) for depression/anxiety .    Orma Flaming, MD [place]   05/25/2020

## 2020-05-25 ENCOUNTER — Other Ambulatory Visit: Payer: Self-pay | Admitting: Family Medicine

## 2020-05-25 DIAGNOSIS — E559 Vitamin D deficiency, unspecified: Secondary | ICD-10-CM | POA: Insufficient documentation

## 2020-05-25 DIAGNOSIS — D696 Thrombocytopenia, unspecified: Secondary | ICD-10-CM

## 2020-05-25 DIAGNOSIS — R772 Abnormality of alphafetoprotein: Secondary | ICD-10-CM

## 2020-05-25 DIAGNOSIS — R748 Abnormal levels of other serum enzymes: Secondary | ICD-10-CM

## 2020-05-25 DIAGNOSIS — B182 Chronic viral hepatitis C: Secondary | ICD-10-CM

## 2020-05-25 LAB — COMPLETE METABOLIC PANEL WITH GFR
AG Ratio: 1.7 (calc) (ref 1.0–2.5)
ALT: 151 U/L — ABNORMAL HIGH (ref 6–29)
AST: 249 U/L — ABNORMAL HIGH (ref 10–35)
Albumin: 4.3 g/dL (ref 3.6–5.1)
Alkaline phosphatase (APISO): 119 U/L (ref 37–153)
BUN/Creatinine Ratio: 11 (calc) (ref 6–22)
BUN: 5 mg/dL — ABNORMAL LOW (ref 7–25)
CO2: 24 mmol/L (ref 20–32)
Calcium: 8.9 mg/dL (ref 8.6–10.4)
Chloride: 107 mmol/L (ref 98–110)
Creat: 0.45 mg/dL — ABNORMAL LOW (ref 0.50–0.99)
GFR, Est African American: 123 mL/min/{1.73_m2} (ref >=60)
GFR, Est Non African American: 106 mL/min/{1.73_m2} (ref >=60)
Globulin: 2.6 g/dL (calc) (ref 1.9–3.7)
Glucose, Bld: 82 mg/dL (ref 65–99)
Potassium: 4 mmol/L (ref 3.5–5.3)
Sodium: 144 mmol/L (ref 135–146)
Total Bilirubin: 1.2 mg/dL (ref 0.2–1.2)
Total Protein: 6.9 g/dL (ref 6.1–8.1)

## 2020-05-25 LAB — TSH: TSH: 2.72 mIU/L (ref 0.40–4.50)

## 2020-05-25 LAB — CBC WITH DIFFERENTIAL/PLATELET
Absolute Monocytes: 363 cells/uL (ref 200–950)
Basophils Absolute: 30 cells/uL (ref 0–200)
Basophils Relative: 1.2 %
Eosinophils Absolute: 130 cells/uL (ref 15–500)
Eosinophils Relative: 5.2 %
HCT: 40.5 % (ref 35.0–45.0)
Hemoglobin: 14.3 g/dL (ref 11.7–15.5)
Lymphs Abs: 1015 cells/uL (ref 850–3900)
MCH: 35 pg — ABNORMAL HIGH (ref 27.0–33.0)
MCHC: 35.3 g/dL (ref 32.0–36.0)
MCV: 99.3 fL (ref 80.0–100.0)
MPV: 11.3 fL (ref 7.5–12.5)
Monocytes Relative: 14.5 %
Neutro Abs: 963 cells/uL — ABNORMAL LOW (ref 1500–7800)
Neutrophils Relative %: 38.5 %
Platelets: 64 10*3/uL — ABNORMAL LOW (ref 140–400)
RBC: 4.08 10*6/uL (ref 3.80–5.10)
RDW: 12.2 % (ref 11.0–15.0)
Total Lymphocyte: 40.6 %
WBC: 2.5 10*3/uL — ABNORMAL LOW (ref 3.8–10.8)

## 2020-05-25 LAB — PROTIME-INR
INR: 1.1
Prothrombin Time: 11.5 s (ref 9.0–11.5)

## 2020-05-25 LAB — LIPID PANEL
Cholesterol: 188 mg/dL (ref <200)
HDL: 62 mg/dL (ref >=50)
LDL Cholesterol (Calc): 109 mg/dL (calc) — ABNORMAL HIGH
Non-HDL Cholesterol (Calc): 126 mg/dL (calc) (ref <130)
Total CHOL/HDL Ratio: 3 (calc) (ref <5.0)
Triglycerides: 78 mg/dL (ref <150)

## 2020-05-25 LAB — VITAMIN D 25 HYDROXY (VIT D DEFICIENCY, FRACTURES): Vit D, 25-Hydroxy: 23 ng/mL — ABNORMAL LOW (ref 30–100)

## 2020-05-25 LAB — AFP TUMOR MARKER: AFP-Tumor Marker: 11.4 ng/mL — ABNORMAL HIGH

## 2020-05-25 MED ORDER — VITAMIN D (ERGOCALCIFEROL) 1.25 MG (50000 UNIT) PO CAPS: ORAL_CAPSULE | ORAL | 0 refills | Status: DC

## 2020-06-04 ENCOUNTER — Telehealth: Payer: Self-pay

## 2020-06-04 NOTE — Telephone Encounter (Signed)
Please let her know that I did not refer her to the orthopedic doctor. That was the ER. I referred her ot the GI doc who treats hep C.  Orma Flaming, MD [place]

## 2020-06-04 NOTE — Telephone Encounter (Signed)
Pt called stating she followed up from previous visit with [facility] for her cracked rib. Pt states she has signs of hep C and they cannot treat that. Pt states they aren't able to do anything about her rib so she does not understand why Dr. Rogers Blocker referred her to them. Please advise.

## 2020-06-05 ENCOUNTER — Telehealth: Payer: Self-pay

## 2020-06-05 NOTE — Telephone Encounter (Signed)
Her sister is listed, but resides in [place].

## 2020-06-05 NOTE — Telephone Encounter (Signed)
Lvm for pt to call the office back, to give message below.

## 2020-06-05 NOTE — Telephone Encounter (Signed)
I gave the pt a call back because, she sounded confused when speaking to her previously. Her speech was slurred and she kept repeating herself even after I has explained instructions. She says that she is feeling okay her ribs were just sore. She mentions that she is getting better she is more concerned about Hep C treatment.

## 2020-06-05 NOTE — Telephone Encounter (Signed)
Does she have someone in her DPR you can call?  Orma Flaming, MD [place]

## 2020-06-05 NOTE — Telephone Encounter (Signed)
I gave pt message below. Pt understands. Pt says that she has an upcoming appointment with [facility] imaging. She has not heard from GI as of yet, for scheduling for Hep C treatment. She was advised to wait another week, and then update [place] if she still has not scheduled with them. Pt agreed.

## 2020-06-05 NOTE — Telephone Encounter (Signed)
Pt has an appt with [facility] on the 21st. She wanted to update Dr. Rogers Blocker. Pt called stating that she called and spoke to a woman "last yesterday" and gave her the number to call. That was me. I spoke with the pt this morning. Both this morning and this afternoon when the patient called she seemed very confused and slightly stuttering. She stated she was confused because her daughter was recently killed.

## 2020-06-05 NOTE — Telephone Encounter (Signed)
I was able to speak with the pt to confirm questions and concerns that she had about referrals that were placed at her NPV with Dr.Wolfe. I explained that she needed to keep appointment with imaging and allow another week for GI scheduling. Pt did seem very confused, and constantly repeated back things that was just confirmed in the conversation. Pt was told to give [place] a call back if she has not heard back from GI within the next week.

## 2020-06-12 ENCOUNTER — Ambulatory Visit
Admission: RE | Admit: 2020-06-12 | Discharge: 2020-06-12 | Disposition: A | Discharge status: Home / Self Care | Payer: 59 | Source: Ambulatory Visit | Attending: Family Medicine | Admitting: Family Medicine

## 2020-06-12 DIAGNOSIS — B182 Chronic viral hepatitis C: Secondary | ICD-10-CM

## 2020-06-12 DIAGNOSIS — R772 Abnormality of alphafetoprotein: Secondary | ICD-10-CM

## 2020-06-13 ENCOUNTER — Telehealth: Payer: Self-pay

## 2020-06-13 NOTE — Telephone Encounter (Signed)
I received a call from pt requesting U/S results. Pt was given lab result message from Dr. Rogers Blocker. Pt was given phone number to [facility] concerning referral that was placed on 05/24/2020. Pt sounds very inebriated, and has repeatedly questioned instructions given in the phone call. Pt will follow up with GI.

## 2020-06-13 NOTE — Telephone Encounter (Signed)
Pt states [facility] has not received the referral.

## 2020-06-13 NOTE — Telephone Encounter (Signed)
error 

## 2020-06-21 ENCOUNTER — Telehealth: Payer: Self-pay

## 2020-06-21 NOTE — Telephone Encounter (Signed)
  LAST APPOINTMENT DATE: 06/28/2019   NEXT APPOINTMENT DATE:@Visit  date not found  MEDICATION:buPROpin (wellbutrin SR) 150 12 hr tablet  PHARMACY:[id number] - [place], [place] - [address] AT [facility] OF [address]    OTHER COMMENTS:  Patient states she is more depressed her daughter just passed and she needs the SR instead of her current dose    Okay for refill?  Please advise

## 2020-06-26 MED ORDER — BUPROPION HCL ER (SR) 150 MG PO TB12: 150.0000 mg | ORAL_TABLET | Freq: Two times a day (BID) | ORAL | 3 refills | Status: DC

## 2020-06-26 NOTE — Telephone Encounter (Signed)
Sent in the SR for her that is twice a day. Please make sure and stop the extended release.  Dr. Artis Flock

## 2020-06-26 NOTE — Telephone Encounter (Signed)
Lvm to make pt aware of medication refill below.

## 2020-06-27 ENCOUNTER — Ambulatory Visit: Payer: 59 | Admitting: Family Medicine

## 2020-07-10 ENCOUNTER — Other Ambulatory Visit (HOSPITAL_COMMUNITY): Payer: Self-pay | Admitting: Gastroenterology

## 2020-07-10 ENCOUNTER — Other Ambulatory Visit: Payer: Self-pay | Admitting: Gastroenterology

## 2020-07-10 DIAGNOSIS — B1920 Unspecified viral hepatitis C without hepatic coma: Secondary | ICD-10-CM

## 2020-08-15 ENCOUNTER — Telehealth: Payer: Self-pay

## 2020-08-15 ENCOUNTER — Other Ambulatory Visit: Payer: Self-pay | Admitting: Family Medicine

## 2020-08-15 NOTE — Telephone Encounter (Signed)
Patient called in and wanted to know why her Ergocalciferol was denied. Patient would like a call back.

## 2020-08-15 NOTE — Telephone Encounter (Signed)
I spoke with the pt to clarify that this was only for 12 weeks, she was advised to start an OTC Vitamin D at 2000/IU. Pt voiced understanding.

## 2020-09-05 ENCOUNTER — Encounter (HOSPITAL_COMMUNITY): Payer: Self-pay

## 2020-09-05 ENCOUNTER — Ambulatory Visit (HOSPITAL_COMMUNITY): Payer: 59

## 2020-09-06 ENCOUNTER — Other Ambulatory Visit: Payer: Self-pay | Admitting: Family Medicine

## 2020-09-06 ENCOUNTER — Telehealth: Payer: Self-pay

## 2020-09-06 NOTE — Telephone Encounter (Signed)
No one informed me i have to have pre-authorization from my insurance company.  no one called or emailed me so i was all ready to come in for the sonagram it 10am tomorrow morning 09/05/20.  i only just this evening a friend helped me get into mychart account.  that's where i saw the Notation:  Canceled Reason:  pre-authentication from ins company required!  did anyone attempt to contact Dr. Benson Norway about this?  tell me what i need to do to get pre-authorization?  there are lot of [place] from the [place] generation that like to do things way out-dated, like:  myself, i still pay my bills by phone at 2 am.  no fuss, no muss, just pay the bill.  my suggestion is that you cover the personal  phone notification-and leave a VM if necessary.   Patient sent above mychart message.    After further research, this order was placed by Dr. Carol Ada.    Dr. Carol Ada is not with our office.  I have left patient a vm to call me back in regard.

## 2020-09-07 ENCOUNTER — Encounter: Payer: Self-pay | Admitting: Family Medicine

## 2020-09-27 ENCOUNTER — Encounter: Payer: Self-pay | Admitting: Family Medicine

## 2020-09-27 ENCOUNTER — Telehealth: Payer: Self-pay

## 2020-09-27 DIAGNOSIS — F32A Depression, unspecified: Secondary | ICD-10-CM

## 2020-09-27 DIAGNOSIS — F419 Anxiety disorder, unspecified: Secondary | ICD-10-CM

## 2020-09-27 NOTE — Telephone Encounter (Signed)
Pt is requesting virtual visit today. Daughters birthday that was murdered was last week. Pt states she is falling apart.

## 2020-09-27 NOTE — Telephone Encounter (Signed)
I spoke with the pt to address concerns today. Pt demanded to be seen today, she states that she is angry about her daughter's death. Pt sounded very intoxicated. Pt says that she scheduled a follow up visit for 4/27th, but that was not going to help her. She continued to ask for at least "5 Valiums". She started to get upset with me after saying that she needed to be seen in Urgent care or the ED. She says I am not being any help to her and her situation. She says that her feelings are permanent and she needs to know what she can do. I continued to advise her to go to the ED, because they may be able to give her something temporary until she sees Dr. Rogers Blocker. She began to insult both myself and Dr. Rogers Blocker. Pt was upset because she was not able to be seen today, and disconnected call.

## 2020-09-27 NOTE — Telephone Encounter (Signed)
Placing urgent referral to psychiatry and also recommend walking into [facility] on [address] as they will be available to provide more immediate care.   Tammy, do not feel comfortable calling her. She has sent very insulting emails and has only been seen one time in this clinic. Demanding valium and other drugs.    Please relay the above message.   Orma Flaming, MD [place]

## 2020-10-04 NOTE — Telephone Encounter (Signed)
I have left pt vm with phone number and address.  I have also informed her that she does not need appointment and that clinic is open 24/7.

## 2020-10-17 ENCOUNTER — Ambulatory Visit: Payer: 59 | Admitting: Family Medicine

## 2020-11-02 ENCOUNTER — Encounter: Payer: Self-pay | Admitting: Family Medicine

## 2020-11-02 ENCOUNTER — Other Ambulatory Visit: Payer: Self-pay

## 2020-11-02 MED ORDER — BUPROPION HCL ER (SR) 150 MG PO TB12: 150.0000 mg | ORAL_TABLET | Freq: Two times a day (BID) | ORAL | 3 refills | Status: DC

## 2020-11-02 MED ORDER — TRAZODONE HCL 100 MG PO TABS: 100.0000 mg | ORAL_TABLET | Freq: Every day | ORAL | 1 refills | Status: AC

## 2020-11-07 ENCOUNTER — Ambulatory Visit: Payer: 59 | Admitting: Family Medicine

## 2021-04-09 DIAGNOSIS — F5101 Primary insomnia: Secondary | ICD-10-CM | POA: Diagnosis not present

## 2021-04-09 DIAGNOSIS — F419 Anxiety disorder, unspecified: Secondary | ICD-10-CM | POA: Diagnosis not present

## 2021-04-09 DIAGNOSIS — F321 Major depressive disorder, single episode, moderate: Secondary | ICD-10-CM | POA: Diagnosis not present

## 2021-05-10 DIAGNOSIS — F419 Anxiety disorder, unspecified: Secondary | ICD-10-CM | POA: Diagnosis not present

## 2021-05-10 DIAGNOSIS — F321 Major depressive disorder, single episode, moderate: Secondary | ICD-10-CM | POA: Diagnosis not present

## 2021-05-10 DIAGNOSIS — F5101 Primary insomnia: Secondary | ICD-10-CM | POA: Diagnosis not present

## 2021-05-28 DIAGNOSIS — S42035A Nondisplaced fracture of lateral end of left clavicle, initial encounter for closed fracture: Secondary | ICD-10-CM | POA: Diagnosis not present

## 2021-05-28 DIAGNOSIS — S4992XA Unspecified injury of left shoulder and upper arm, initial encounter: Secondary | ICD-10-CM | POA: Diagnosis not present
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
## 2021-09-02 DIAGNOSIS — F321 Major depressive disorder, single episode, moderate: Secondary | ICD-10-CM | POA: Diagnosis not present

## 2021-09-02 DIAGNOSIS — F411 Generalized anxiety disorder: Secondary | ICD-10-CM | POA: Diagnosis not present

## 2021-09-26 DIAGNOSIS — S60872A Other superficial bite of left wrist, initial encounter: Secondary | ICD-10-CM | POA: Diagnosis not present

## 2021-09-26 DIAGNOSIS — W5501XA Bitten by cat, initial encounter: Secondary | ICD-10-CM | POA: Diagnosis not present

## 2021-09-26 DIAGNOSIS — S61452A Open bite of left hand, initial encounter: Secondary | ICD-10-CM | POA: Diagnosis not present

## 2021-12-09 ENCOUNTER — Encounter: Payer: Self-pay | Admitting: Family Medicine

## 2021-12-20 DIAGNOSIS — R2689 Other abnormalities of gait and mobility: Secondary | ICD-10-CM | POA: Diagnosis not present

## 2021-12-20 DIAGNOSIS — F321 Major depressive disorder, single episode, moderate: Secondary | ICD-10-CM | POA: Diagnosis not present

## 2021-12-20 DIAGNOSIS — R636 Underweight: Secondary | ICD-10-CM | POA: Diagnosis not present

## 2021-12-20 DIAGNOSIS — F411 Generalized anxiety disorder: Secondary | ICD-10-CM | POA: Diagnosis not present

## 2022-01-30 DIAGNOSIS — R7401 Elevation of levels of liver transaminase levels: Secondary | ICD-10-CM | POA: Diagnosis not present

## 2022-01-30 DIAGNOSIS — D7589 Other specified diseases of blood and blood-forming organs: Secondary | ICD-10-CM | POA: Diagnosis not present

## 2022-01-30 DIAGNOSIS — R748 Abnormal levels of other serum enzymes: Secondary | ICD-10-CM | POA: Diagnosis not present

## 2022-01-30 DIAGNOSIS — D696 Thrombocytopenia, unspecified: Secondary | ICD-10-CM | POA: Diagnosis not present

## 2022-02-05 ENCOUNTER — Other Ambulatory Visit: Payer: Self-pay | Admitting: Family Medicine

## 2022-02-05 DIAGNOSIS — D7589 Other specified diseases of blood and blood-forming organs: Secondary | ICD-10-CM | POA: Diagnosis not present

## 2022-02-05 DIAGNOSIS — R768 Other specified abnormal immunological findings in serum: Secondary | ICD-10-CM

## 2022-02-05 DIAGNOSIS — D696 Thrombocytopenia, unspecified: Secondary | ICD-10-CM | POA: Diagnosis not present

## 2022-02-05 DIAGNOSIS — F5101 Primary insomnia: Secondary | ICD-10-CM | POA: Diagnosis not present

## 2022-02-05 DIAGNOSIS — R748 Abnormal levels of other serum enzymes: Secondary | ICD-10-CM

## 2022-02-05 DIAGNOSIS — R7401 Elevation of levels of liver transaminase levels: Secondary | ICD-10-CM | POA: Diagnosis not present

## 2022-02-07 ENCOUNTER — Other Ambulatory Visit: Payer: Self-pay

## 2022-02-07 ENCOUNTER — Ambulatory Visit
Admission: RE | Admit: 2022-02-07 | Discharge: 2022-02-07 | Disposition: A | Discharge status: Home / Self Care | Payer: Self-pay | Source: Ambulatory Visit | Attending: Family Medicine | Admitting: Family Medicine

## 2022-02-07 DIAGNOSIS — K7689 Other specified diseases of liver: Secondary | ICD-10-CM | POA: Diagnosis not present

## 2022-02-07 DIAGNOSIS — R768 Other specified abnormal immunological findings in serum: Secondary | ICD-10-CM

## 2022-02-07 DIAGNOSIS — K760 Fatty (change of) liver, not elsewhere classified: Secondary | ICD-10-CM | POA: Diagnosis not present

## 2022-02-07 DIAGNOSIS — R748 Abnormal levels of other serum enzymes: Secondary | ICD-10-CM

## 2022-02-20 DIAGNOSIS — F419 Anxiety disorder, unspecified: Secondary | ICD-10-CM | POA: Diagnosis not present

## 2022-02-20 DIAGNOSIS — F341 Dysthymic disorder: Secondary | ICD-10-CM | POA: Diagnosis not present

## 2022-03-07 DIAGNOSIS — M5412 Radiculopathy, cervical region: Secondary | ICD-10-CM | POA: Diagnosis not present

## 2022-03-18 DIAGNOSIS — F341 Dysthymic disorder: Secondary | ICD-10-CM | POA: Diagnosis not present

## 2022-03-18 DIAGNOSIS — F419 Anxiety disorder, unspecified: Secondary | ICD-10-CM | POA: Diagnosis not present

## 2022-04-09 ENCOUNTER — Other Ambulatory Visit: Payer: Self-pay | Admitting: Family Medicine

## 2022-04-09 DIAGNOSIS — M5412 Radiculopathy, cervical region: Secondary | ICD-10-CM

## 2022-04-30 DIAGNOSIS — F341 Dysthymic disorder: Secondary | ICD-10-CM | POA: Diagnosis not present

## 2022-04-30 DIAGNOSIS — F419 Anxiety disorder, unspecified: Secondary | ICD-10-CM | POA: Diagnosis not present

## 2022-05-05 DIAGNOSIS — Z1211 Encounter for screening for malignant neoplasm of colon: Secondary | ICD-10-CM | POA: Diagnosis not present

## 2022-05-05 DIAGNOSIS — K7469 Other cirrhosis of liver: Secondary | ICD-10-CM | POA: Diagnosis not present

## 2022-05-05 DIAGNOSIS — B182 Chronic viral hepatitis C: Secondary | ICD-10-CM | POA: Diagnosis not present

## 2022-05-05 DIAGNOSIS — F17200 Nicotine dependence, unspecified, uncomplicated: Secondary | ICD-10-CM | POA: Diagnosis not present

## 2022-05-05 DIAGNOSIS — Z789 Other specified health status: Secondary | ICD-10-CM | POA: Diagnosis not present

## 2022-05-06 ENCOUNTER — Other Ambulatory Visit: Payer: Self-pay | Admitting: Gastroenterology

## 2022-05-06 DIAGNOSIS — K7469 Other cirrhosis of liver: Secondary | ICD-10-CM

## 2022-05-07 ENCOUNTER — Other Ambulatory Visit: Payer: Self-pay

## 2022-05-08 DIAGNOSIS — R718 Other abnormality of red blood cells: Secondary | ICD-10-CM | POA: Diagnosis not present

## 2022-05-29 ENCOUNTER — Inpatient Hospital Stay: Admission: RE | Admit: 2022-05-29 | Payer: Self-pay | Source: Ambulatory Visit

## 2022-06-26 DIAGNOSIS — K7469 Other cirrhosis of liver: Secondary | ICD-10-CM | POA: Diagnosis not present

## 2022-06-26 DIAGNOSIS — R202 Paresthesia of skin: Secondary | ICD-10-CM | POA: Diagnosis not present

## 2022-06-26 DIAGNOSIS — Z789 Other specified health status: Secondary | ICD-10-CM | POA: Diagnosis not present

## 2022-06-26 DIAGNOSIS — R413 Other amnesia: Secondary | ICD-10-CM | POA: Diagnosis not present

## 2022-06-26 DIAGNOSIS — F419 Anxiety disorder, unspecified: Secondary | ICD-10-CM | POA: Diagnosis not present

## 2022-06-26 DIAGNOSIS — F321 Major depressive disorder, single episode, moderate: Secondary | ICD-10-CM | POA: Diagnosis not present

## 2022-06-26 DIAGNOSIS — B182 Chronic viral hepatitis C: Secondary | ICD-10-CM | POA: Diagnosis not present

## 2022-06-27 ENCOUNTER — Other Ambulatory Visit: Payer: Self-pay | Admitting: Family Medicine

## 2022-06-27 DIAGNOSIS — R413 Other amnesia: Secondary | ICD-10-CM

## 2022-06-29 ENCOUNTER — Inpatient Hospital Stay: Admission: RE | Admit: 2022-06-29 | Payer: Medicare HMO | Source: Ambulatory Visit

## 2022-07-01 ENCOUNTER — Ambulatory Visit: Payer: Medicare HMO | Admitting: Neurology

## 2022-07-01 ENCOUNTER — Encounter: Payer: Self-pay | Admitting: Neurology

## 2022-07-01 ENCOUNTER — Telehealth: Payer: Self-pay | Admitting: Neurology

## 2022-07-01 VITALS — BP 114/73 | HR 87 | Ht 66.0 in | Wt 120.0 lb

## 2022-07-01 DIAGNOSIS — R413 Other amnesia: Secondary | ICD-10-CM | POA: Insufficient documentation

## 2022-07-01 DIAGNOSIS — R202 Paresthesia of skin: Secondary | ICD-10-CM | POA: Diagnosis not present

## 2022-07-01 MED ORDER — GABAPENTIN 300 MG PO CAPS: 600.0000 mg | ORAL_CAPSULE | Freq: Every day | ORAL | 6 refills | Status: DC

## 2022-07-01 NOTE — Progress Notes (Signed)
Chief Complaint  Patient presents with   New Patient (Initial Visit)    Rm 14. Alone. NP/[facility]/memory changes, paresthesia in bilateral hands.      ASSESSMENT AND PLAN  Regina Smith is a 67 y.o. female  Memory loss  Happened in the setting of worsening mood disorder, extreme family stress, and personal illness  Strong family history of central nervous system degenerative disorder,  MRI of brain,  Laboratory evaluation to rule out treatable etiology  Tremor:  Most consistent with exaggerated physiological tremor  Check TSH  Return To Clinic With NP In 6 Months   DIAGNOSTIC DATA (LABS, IMAGING, TESTING) - I reviewed patient records, labs, notes, testing and imaging myself where available.  Laboratory evaluation from [facility] in November 2023, normal actin,  mitochondrial antibody, AFP 9.2 was within normal limit, bili was elevated 2.6, CMP showed elevation of AST 167, ALT 81, normal creatinine 0.5, hemoglobin of 14.8, 5/1.20, Hereditary hemochromatosis was negative,  Hepatitis C 475000, HCV was +5.67,  MEDICAL HISTORY:  Regina Smith, is a 67 year old female seen in request by her primary care from [facility], [facility], for evaluation of tremor, memory loss, paresthesia    I reviewed and summarized the referring note. PMHX Hepatitis C with cirrhosis Depression Anxiety Alcohol abuse, quit in Dec 2023,  12 oz of wine.  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  PHYSICAL EXAMNIATION:  Gen: NAD, conversant, well nourised, well groomed                     Cardiovascular: Regular rate rhythm, no peripheral edema, warm, nontender. Eyes: Conjunctivae clear without exudates or hemorrhage Neck: Supple, no carotid bruits. Pulmonary: Clear to auscultation bilaterally   NEUROLOGICAL EXAM:  MENTAL STATUS: Speech/cognition: Awake, alert, oriented to history taking and casual conversation    07/01/2022   11:20 AM  Montreal Cognitive Assessment   Visuospatial/ Executive (0/5) 3  Naming (0/3) 3  Attention: Read list of digits (0/2) 2  Attention: Read list of letters (0/1) 1  Attention: Serial 7 subtraction starting at 100 (0/3) 3  Language: Repeat phrase (0/2) 2  Language : Fluency (0/1) 1  Abstraction (0/2) 2  Delayed Recall (0/5) 3  Orientation (0/6) 4  Total 24  Adjusted Score (based on education) 24    CRANIAL NERVES: CN II: Visual fields are full to confrontation. Pupils are round equal and briskly reactive to light. CN III, IV, VI: extraocular movement are normal. No ptosis. CN V: Facial sensation is intact to light touch CN VII: Face is symmetric with normal eye closure  CN VIII: Hearing is normal to causal conversation. CN IX, X: Phonation is normal. CN XI: Head turning and shoulder shrug are intact  MOTOR:  There is no pronator drift of out-stretched arms. Muscle bulk and tone are normal. Muscle strength is normal.  REFLEXES: Reflexes are 2+ and symmetric at the biceps, triceps, knees, and ankles. Plantar responses are flexor.  SENSORY: Intact to light touch, pinprick and vibratory sensation are intact in fingers and toes.  COORDINATION: There is no trunk or limb dysmetria noted.  GAIT/STANCE: Posture is normal.  Gait is steady with normal steps, base, arm swing, and turning. Heel and toe walking are normal. Tandem gait is normal.  Romberg is absent.  REVIEW OF SYSTEMS:  Full 14 system review of systems performed and notable only for as above All other review of systems were negative.   ALLERGIES: Allergies  Allergen Reactions   Celexa [Citalopram] Other (See Comments)    unbalance   Effexor [Venlafaxine] Other (See Comments)    dizziness   Prozac [Fluoxetine] Other (See Comments)    tremors   Vitamin D-3 Nausea Only    HOME MEDICATIONS: Current Outpatient Medications  Medication Sig Dispense Refill   cyanocobalamin (VITAMIN B12) 1000 MCG tablet Take 1,000 mcg by mouth daily.     diazepam (VALIUM) 2 MG tablet Take 2 mg by mouth every 6 (six) hours as needed for anxiety.     EPCLUSA 400-100 MG TABS Take 1 tablet by mouth daily.     folic acid (FOLVITE) 1 MG tablet Take 1 mg by mouth daily.     traZODone (DESYREL) 100 MG tablet Take 1 tablet (100 mg total) by mouth at bedtime. 90 tablet 1   No current facility-administered medications for this visit.    PAST MEDICAL HISTORY: Past Medical History:  Diagnosis Date   Alcohol use    Anxiety    Chronic hepatitis C ([place])    Cirrhosis of liver ([place])    Current moderate episode of major depressive disorder without prior episode ([place])    Memory changes    Paresthesias     PAST SURGICAL HISTORY: History reviewed. No pertinent surgical history.  FAMILY HISTORY: Family History  Problem Relation Age of Onset   Alzheimer's disease Mother    Heart attack Father     SOCIAL HISTORY: Social History   Socioeconomic History   Marital status: Divorced    Spouse name: Not on file   Number of children: Not on file   Years of education: Not on file   Highest education level: Not on file  Occupational History   Not on file  Tobacco Use   Smoking status: Every Day    Packs/day: 0.50    Years: 40.00    Total pack years: 20.00     Types: Cigarettes   Smokeless tobacco: Never  Vaping Use   Vaping Use: Never used  Substance and Sexual Activity   Alcohol use: Not Currently    Comment: former- beer or wine   Drug use: No   Sexual activity: Not Currently  Other Topics Concern   Not on file  Social History Narrative   ** Merged History Encounter **       Social Determinants of Health   Financial Resource Strain: Not on file  Food Insecurity: Not on file  Transportation Needs: Not on file  Physical Activity: Not on file  Stress: Not on file  Social Connections: Not on file  Intimate Partner Violence: Not on file      Marcial Pacas, M.D. Ph.D.  [facility] [address] [place], [place] [address] Ph: [phone] Fax: [phone]  CC:  Lois Huxley, PA [address] [place],  [place] [address]  Lois Huxley, [place]

## 2022-07-01 NOTE — Telephone Encounter (Signed)
Pt is requesting lab results from today to be mailed to her when they are available. Verified address on file is correct.

## 2022-07-02 LAB — RPR: RPR Ser Ql: NONREACTIVE

## 2022-07-02 LAB — THYROID PANEL WITH TSH
Free Thyroxine Index: 2 (ref 1.2–4.9)
T3 Uptake Ratio: 24 % (ref 24–39)
T4, Total: 8.5 ug/dL (ref 4.5–12.0)
TSH: 1.19 u[IU]/mL (ref 0.450–4.500)

## 2022-07-02 LAB — HIV ANTIBODY (ROUTINE TESTING W REFLEX): HIV Screen 4th Generation wRfx: NONREACTIVE

## 2022-07-02 LAB — VITAMIN B12: Vitamin B-12: 1061 pg/mL (ref 232–1245)

## 2022-07-03 ENCOUNTER — Telehealth: Payer: Self-pay | Admitting: Neurology

## 2022-07-03 NOTE — Telephone Encounter (Signed)
Regina Smith: [id number] exp. 07/03/22-08/02/22 sent to GI [phone]

## 2022-07-04 ENCOUNTER — Telehealth: Payer: Self-pay | Admitting: Neurology

## 2022-07-04 DIAGNOSIS — R202 Paresthesia of skin: Secondary | ICD-10-CM

## 2022-07-04 NOTE — Telephone Encounter (Signed)
Pt called stating that the gabapentin (NEURONTIN) 300 MG capsule that was prescribed to her is not helping with her pain. Pt would like to know if something better can be prescribed to her so that she can sleep at night without pain. Please advise.

## 2022-07-07 NOTE — Telephone Encounter (Signed)
Labs finalized, I have mailed as requested. Dr. Krista Blue sent my chart message updating on the findings.

## 2022-07-08 DIAGNOSIS — Z13810 Encounter for screening for upper gastrointestinal disorder: Secondary | ICD-10-CM | POA: Diagnosis not present

## 2022-07-08 DIAGNOSIS — R6889 Other general symptoms and signs: Secondary | ICD-10-CM | POA: Diagnosis not present

## 2022-07-08 DIAGNOSIS — K3189 Other diseases of stomach and duodenum: Secondary | ICD-10-CM | POA: Diagnosis not present

## 2022-07-08 DIAGNOSIS — K649 Unspecified hemorrhoids: Secondary | ICD-10-CM | POA: Diagnosis not present

## 2022-07-08 DIAGNOSIS — Z1211 Encounter for screening for malignant neoplasm of colon: Secondary | ICD-10-CM | POA: Diagnosis not present

## 2022-07-08 DIAGNOSIS — K2930 Chronic superficial gastritis without bleeding: Secondary | ICD-10-CM | POA: Diagnosis not present

## 2022-07-08 DIAGNOSIS — K7460 Unspecified cirrhosis of liver: Secondary | ICD-10-CM | POA: Diagnosis not present

## 2022-07-09 MED ORDER — PREGABALIN 100 MG PO CAPS: 100.0000 mg | ORAL_CAPSULE | Freq: Three times a day (TID) | ORAL | 3 refills | Status: AC

## 2022-07-09 NOTE — Telephone Encounter (Signed)
I called patient at [phone], left message for her to call back   gabapentin up to 600 mg every night does not help her paresthesia, start Lyrica, please let her know, do not overlap  Meds ordered this encounter  Medications   pregabalin (LYRICA) 100 MG capsule    Sig: Take 1 capsule (100 mg total) by mouth 3 (three) times daily.    Dispense:  90 capsule    Refill:  3    Do not refill in less than 30 days

## 2022-07-09 NOTE — Telephone Encounter (Signed)
I called pt. No answer, left a message asking pt to call me back.   

## 2022-07-09 NOTE — Addendum Note (Signed)
Addended by: Marcial Pacas on: 07/09/2022 03:46 PM   Modules accepted: Orders

## 2022-07-09 NOTE — Telephone Encounter (Signed)
Pt returned my call. She reports gabapentin is not helping with her paraesthesia symptoms, she is taking Gabapentin 600 mg at night for 11 days now w/o relief. Pt is requesting trial of a new med to see if symptoms will be better controlled. She is not sleeping well at night due to her uncontrollable symptoms.

## 2022-07-10 DIAGNOSIS — K2930 Chronic superficial gastritis without bleeding: Secondary | ICD-10-CM | POA: Diagnosis not present

## 2022-07-10 NOTE — Telephone Encounter (Signed)
Attempted to call and had LVM rq CB

## 2022-07-10 NOTE — Telephone Encounter (Signed)
Sent [facility] message as well

## 2022-07-13 ENCOUNTER — Other Ambulatory Visit: Payer: Self-pay | Admitting: Family Medicine

## 2022-07-16 ENCOUNTER — Encounter (HOSPITAL_COMMUNITY): Payer: Self-pay

## 2022-07-16 ENCOUNTER — Emergency Department (HOSPITAL_COMMUNITY): Payer: Medicare HMO

## 2022-07-16 ENCOUNTER — Emergency Department (HOSPITAL_COMMUNITY)
Admission: EM | Admit: 2022-07-16 | Discharge: 2022-07-16 | Discharge status: Against Medical Advice | Payer: Medicare HMO | Attending: Student | Admitting: Student

## 2022-07-16 ENCOUNTER — Other Ambulatory Visit: Payer: Self-pay

## 2022-07-16 ENCOUNTER — Ambulatory Visit
Admission: RE | Admit: 2022-07-16 | Discharge: 2022-07-16 | Disposition: A | Discharge status: Home / Self Care | Payer: Medicare HMO | Source: Ambulatory Visit | Attending: Neurology | Admitting: Neurology

## 2022-07-16 DIAGNOSIS — M25561 Pain in right knee: Secondary | ICD-10-CM | POA: Diagnosis not present

## 2022-07-16 DIAGNOSIS — M25571 Pain in right ankle and joints of right foot: Secondary | ICD-10-CM | POA: Diagnosis not present

## 2022-07-16 DIAGNOSIS — M25562 Pain in left knee: Secondary | ICD-10-CM | POA: Insufficient documentation

## 2022-07-16 DIAGNOSIS — S99921A Unspecified injury of right foot, initial encounter: Secondary | ICD-10-CM | POA: Diagnosis not present

## 2022-07-16 DIAGNOSIS — R413 Other amnesia: Secondary | ICD-10-CM

## 2022-07-16 DIAGNOSIS — Z5321 Procedure and treatment not carried out due to patient leaving prior to being seen by health care provider: Secondary | ICD-10-CM | POA: Diagnosis not present

## 2022-07-16 DIAGNOSIS — M79671 Pain in right foot: Secondary | ICD-10-CM | POA: Insufficient documentation

## 2022-07-16 DIAGNOSIS — R079 Chest pain, unspecified: Secondary | ICD-10-CM | POA: Diagnosis not present

## 2022-07-16 DIAGNOSIS — Y92410 Unspecified street and highway as the place of occurrence of the external cause: Secondary | ICD-10-CM | POA: Diagnosis not present

## 2022-07-16 DIAGNOSIS — Z1152 Encounter for screening for COVID-19: Secondary | ICD-10-CM | POA: Diagnosis not present

## 2022-07-16 DIAGNOSIS — M79672 Pain in left foot: Secondary | ICD-10-CM | POA: Diagnosis not present

## 2022-07-16 DIAGNOSIS — R0789 Other chest pain: Secondary | ICD-10-CM | POA: Diagnosis not present

## 2022-07-16 DIAGNOSIS — R202 Paresthesia of skin: Secondary | ICD-10-CM | POA: Diagnosis not present

## 2022-07-16 LAB — CBC WITH DIFFERENTIAL/PLATELET
Abs Immature Granulocytes: 0.01 10*3/uL (ref 0.00–0.07)
Basophils Absolute: 0 10*3/uL (ref 0.0–0.1)
Basophils Relative: 1 %
Eosinophils Absolute: 0.1 10*3/uL (ref 0.0–0.5)
Eosinophils Relative: 2 %
HCT: 38.3 % (ref 36.0–46.0)
Hemoglobin: 13 g/dL (ref 12.0–15.0)
Immature Granulocytes: 0 %
Lymphocytes Relative: 20 %
Lymphs Abs: 1 10*3/uL (ref 0.7–4.0)
MCH: 33.5 pg (ref 26.0–34.0)
MCHC: 33.9 g/dL (ref 30.0–36.0)
MCV: 98.7 fL (ref 80.0–100.0)
Monocytes Absolute: 0.7 10*3/uL (ref 0.1–1.0)
Monocytes Relative: 14 %
Neutro Abs: 3.4 10*3/uL (ref 1.7–7.7)
Neutrophils Relative %: 63 %
Platelets: 63 10*3/uL — ABNORMAL LOW (ref 150–400)
RBC: 3.88 MIL/uL (ref 3.87–5.11)
RDW: 13.4 % (ref 11.5–15.5)
WBC: 5.3 10*3/uL (ref 4.0–10.5)
nRBC: 0 % (ref 0.0–0.2)

## 2022-07-16 LAB — COMPREHENSIVE METABOLIC PANEL
ALT: 26 U/L (ref 0–44)
AST: 44 U/L — ABNORMAL HIGH (ref 15–41)
Albumin: 3.2 g/dL — ABNORMAL LOW (ref 3.5–5.0)
Alkaline Phosphatase: 75 U/L (ref 38–126)
Anion gap: 7 (ref 5–15)
BUN: 7 mg/dL — ABNORMAL LOW (ref 8–23)
CO2: 22 mmol/L (ref 22–32)
Calcium: 8.6 mg/dL — ABNORMAL LOW (ref 8.9–10.3)
Chloride: 110 mmol/L (ref 98–111)
Creatinine, Ser: 0.5 mg/dL (ref 0.44–1.00)
GFR, Estimated: >60 mL/min (ref >60)
Glucose, Bld: 133 mg/dL — ABNORMAL HIGH (ref 70–99)
Potassium: 3.7 mmol/L (ref 3.5–5.1)
Sodium: 139 mmol/L (ref 135–145)
Total Bilirubin: 1 mg/dL (ref 0.3–1.2)
Total Protein: 6.3 g/dL — ABNORMAL LOW (ref 6.5–8.1)

## 2022-07-16 LAB — AMMONIA: Ammonia: 57 umol/L — ABNORMAL HIGH (ref 9–35)

## 2022-07-16 LAB — RESP PANEL BY RT-PCR (RSV, FLU A&B, COVID)  RVPGX2
Influenza A by PCR: NEGATIVE
Influenza B by PCR: NEGATIVE
Resp Syncytial Virus by PCR: NEGATIVE
SARS Coronavirus 2 by RT PCR: NEGATIVE
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
## 2022-07-16 NOTE — ED Provider Notes (Signed)
Patient borderline febrile. Tremulous on exam. History of cirrhosis. Labs ordered.   Suzy Bouchard, PA-C 07/16/22 1709    Blanchie Dessert, MD 07/16/22 1750

## 2022-07-21 ENCOUNTER — Encounter: Payer: Self-pay | Admitting: Neurology

## 2022-07-21 NOTE — Telephone Encounter (Signed)
Pt is calling. Stated pregabalin and Gabapentin is not relieving the pain in hands. Stated she can't sleep at night. Pt is requesting a call back from nurse.

## 2022-07-21 NOTE — Telephone Encounter (Signed)
Message sent to MD on this, waiting on response.

## 2022-07-21 NOTE — Telephone Encounter (Signed)
Pt said, pregabalin and Gabapentin is not relieving the pain in hands. Can not sleep at night for the pain. Would like a call from the nurse to discuss prescribing something stronger.

## 2022-08-01 ENCOUNTER — Telehealth: Payer: Self-pay | Admitting: Neurology

## 2022-08-01 NOTE — Telephone Encounter (Signed)
She called today to say that she was experiencing more painful dysesthesias in the arms.  She reported that she started pregabalin 100 mg p.o. 3 times daily about a week ago I let her know that the drug can take longer to take effect and I recommend her giving it a little more time.  She also asked about an MRI.  I let her know that I would forward the message to you about her worsening symptoms.  It looks like you are going to be doing an EMG soon

## 2022-08-05 ENCOUNTER — Telehealth: Payer: Self-pay | Admitting: Neurology

## 2022-08-05 NOTE — Telephone Encounter (Signed)
Pt called back and the message from [place], [place] was relayed

## 2022-08-05 NOTE — Telephone Encounter (Signed)
Pt returned call. Please call back when available. 

## 2022-08-05 NOTE — Telephone Encounter (Signed)
Pt was called, vm left asking pt to call re: message from RN about her medication.

## 2022-08-05 NOTE — Telephone Encounter (Signed)
Pt is calling. Stated her puppy turned over her medication  gabapentin (NEURONTIN) 300 MG capsule. Pt said she can;t get a refill until 2/17 pt is asking for enough medication she can get her refill.

## 2022-08-06 MED ORDER — GABAPENTIN 300 MG PO CAPS: 600.0000 mg | ORAL_CAPSULE | Freq: Every day | ORAL | 0 refills | Status: AC

## 2022-08-06 NOTE — Telephone Encounter (Signed)
Pt states she called the [facility] and they informed her that [facility] will have to call in the 5 day amount, they are unable to call the office.  Pt does not recall the reason they told her they are unable to call.

## 2022-08-06 NOTE — Telephone Encounter (Signed)
Usually if it is the same medication, the pharmacy will not fill until date (2/17) whether there is a new prescription or not, but I will send to Dr. Krista Blue for review, but since it is such a short time period. We can't do anything at this point more at this point

## 2022-08-06 NOTE — Addendum Note (Signed)
Addended by: Verlin Grills on: 08/06/2022 10:14 AM   Modules accepted: Orders

## 2022-08-06 NOTE — Telephone Encounter (Signed)
Rx for a 5 days worth has been sent of gabapentin, hopefully she can pick it up.

## 2022-09-01 DIAGNOSIS — K7469 Other cirrhosis of liver: Secondary | ICD-10-CM | POA: Diagnosis not present

## 2022-09-01 DIAGNOSIS — Z1211 Encounter for screening for malignant neoplasm of colon: Secondary | ICD-10-CM | POA: Diagnosis not present

## 2022-09-01 DIAGNOSIS — B182 Chronic viral hepatitis C: Secondary | ICD-10-CM | POA: Diagnosis not present

## 2022-09-02 ENCOUNTER — Other Ambulatory Visit: Payer: Self-pay | Admitting: Gastroenterology

## 2022-09-02 DIAGNOSIS — B182 Chronic viral hepatitis C: Secondary | ICD-10-CM

## 2022-09-02 DIAGNOSIS — K7469 Other cirrhosis of liver: Secondary | ICD-10-CM

## 2022-09-03 ENCOUNTER — Other Ambulatory Visit: Payer: Self-pay | Admitting: Gastroenterology

## 2022-09-03 DIAGNOSIS — D696 Thrombocytopenia, unspecified: Secondary | ICD-10-CM

## 2022-09-03 DIAGNOSIS — R768 Other specified abnormal immunological findings in serum: Secondary | ICD-10-CM

## 2022-09-03 DIAGNOSIS — K7460 Unspecified cirrhosis of liver: Secondary | ICD-10-CM

## 2023-01-01 ENCOUNTER — Ambulatory Visit: Payer: Medicare HMO | Admitting: Adult Health

## 2023-01-01 ENCOUNTER — Encounter: Payer: Self-pay | Admitting: Adult Health

## 2023-01-01 NOTE — Progress Notes (Deleted)
No chief complaint on file.     ASSESSMENT AND PLAN  Regina Smith is a 67 y.o. female  Memory loss  Happened in the setting of worsening mood disorder, extreme family stress, and personal illness  Strong family history of central nervous system degenerative disorder,  MRI of brain 06/2022 largely unremarkable  Laboratory evaluation unremarkable for reversible causes  Tremor:  Most consistent with exaggerated physiological tremor  TSH normal  Paresthesias  Inadequate response with gabapentin  Lyrica ***  Order placed for EMG/NCV with Regina Smith ***     Return To Clinic With NP In 6 Months   DIAGNOSTIC DATA (LABS, IMAGING, TESTING) - I reviewed patient records, labs, notes, testing and imaging myself where available.  Laboratory evaluation from [facility] in November 2023, normal actin,  mitochondrial antibody, AFP 9.2 was within normal limit, bili was elevated 2.6, CMP showed elevation of AST 167, ALT 81, normal creatinine 0.5, hemoglobin of 14.8, 5/1.20, Hereditary hemochromatosis was negative,  Hepatitis C 475000, HCV was +5.67,   Laboratory evaluation 06/2022 TSH 1.190, B12 1061, RPR nonreactive, HIV antibody nonreactive  MRI brain 07/16/2022 IMPRESSION:  MRI brain(without) demonstrating: - Mild periventricular and subcortical foci of non-specific T2 hyperintensities. - No acute findings.    MEDICAL HISTORY:   Update 01/01/2023 Regina Smith: Returns for 60-month follow-up.        Consult visit 07/01/2022 Regina Smith: Regina Smith, is a 67 year old female seen in request by her primary care from [facility], Regina Smith, for evaluation of tremor, memory loss, paresthesia    I reviewed and summarized the referring note. PMHX Hepatitis C with cirrhosis Depression Anxiety Alcohol abuse, quit in Dec 2023,  12 oz of wine.  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  Not recorded     There is no height or weight on file to calculate BMI.  PHYSICAL EXAMNIATION:  Gen: NAD, conversant, well nourised, well groomed                     Cardiovascular: Regular rate rhythm, no peripheral edema, warm, nontender. Eyes: Conjunctivae clear without exudates or hemorrhage Neck: Supple, no carotid bruits. Pulmonary: Clear to auscultation bilaterally   NEUROLOGICAL EXAM:  MENTAL STATUS: Speech/cognition: Awake, alert, oriented to history taking and casual conversation    07/01/2022   11:20 AM  Montreal Cognitive Assessment   Visuospatial/ Executive (0/5) 3  Naming (0/3) 3  Attention: Read list of digits (0/2) 2  Attention: Read list of letters (0/1) 1  Attention: Serial 7 subtraction starting at 100 (0/3) 3  Language: Repeat phrase (0/2) 2  Language : Fluency (0/1) 1  Abstraction (0/2) 2  Delayed Recall (0/5) 3  Orientation (0/6) 4  Total 24  Adjusted Score (based on education) 24    CRANIAL NERVES: CN II: Visual fields are full to confrontation. Pupils are round equal and briskly reactive to light. CN III, IV, VI: extraocular movement are  normal. No ptosis. CN V: Facial sensation is intact to light touch CN VII: Face is symmetric with normal eye closure  CN VIII: Hearing is normal to causal conversation. CN IX, X: Phonation is normal. CN XI: Head turning and shoulder shrug are intact  MOTOR: There is no pronator drift of out-stretched arms. Muscle bulk and tone are normal. Muscle  strength is normal.  REFLEXES: Reflexes are 2+ and symmetric at the biceps, triceps, knees, and ankles. Plantar responses are flexor.  SENSORY: Intact to light touch, pinprick and vibratory sensation are intact in fingers and toes.  COORDINATION: There is no trunk or limb dysmetria noted.  GAIT/STANCE: Posture is normal. Gait is steady with normal steps, base, arm swing, and turning. Heel and toe walking are normal. Tandem gait is normal.  Romberg is absent.  REVIEW OF SYSTEMS:  Full 14 system review of systems performed and notable only for as above All other review of systems were negative.   ALLERGIES: Allergies  Allergen Reactions   Celexa [Citalopram] Other (See Comments)    unbalance   Effexor [Venlafaxine] Other (See Comments)    dizziness   Prozac [Fluoxetine] Other (See Comments)    tremors   Vitamin D-3 Nausea Only    HOME MEDICATIONS: Current Outpatient Medications  Medication Sig Dispense Refill   cyanocobalamin (VITAMIN B12) 1000 MCG tablet Take 1,000 mcg by mouth daily.     diazepam (VALIUM) 2 MG tablet Take 2 mg by mouth every 6 (six) hours as needed for anxiety.     EPCLUSA 400-100 MG TABS Take 1 tablet by mouth daily.     folic acid (FOLVITE) 1 MG tablet Take 1 mg by mouth daily.     gabapentin (NEURONTIN) 300 MG capsule Take 2 capsules (600 mg total) by mouth at bedtime. 10 capsule 0   pregabalin (LYRICA) 100 MG capsule Take 1 capsule (100 mg total) by mouth 3 (three) times daily. 90 capsule 3   traZODone (DESYREL) 100 MG tablet Take 1 tablet (100 mg total) by mouth at bedtime. 90 tablet 1   No current facility-administered medications for this visit.    PAST MEDICAL HISTORY: Past Medical History:  Diagnosis Date   Alcohol use    Anxiety    Chronic hepatitis C ([facility])    Cirrhosis of liver ([facility])    Current moderate episode of major depressive disorder without prior episode ([facility])    Memory changes    Paresthesias     PAST SURGICAL HISTORY: No  past surgical history on file.  FAMILY HISTORY: Family History  Problem Relation Age of Onset   Alzheimer's disease Mother    Heart attack Father     SOCIAL HISTORY: Social History   Socioeconomic History   Marital status: Divorced    Spouse name: Not on file   Number of children: Not on file   Years of education: Not on file   Highest education level: Not on file  Occupational History   Not on file  Tobacco Use   Smoking status: Every Day    Current packs/day: 0.50    Average packs/day: 0.5 packs/day for 40.0 years (20.0 ttl pk-yrs)    Types: Cigarettes   Smokeless tobacco: Never  Vaping Use   Vaping status: Never Used  Substance and Sexual Activity   Alcohol use: Not Currently    Comment: former- beer or wine   Drug use: No   Sexual activity: Not Currently  Other Topics Concern   Not on file  Social History Narrative   ** Merged History Encounter **       Social Determinants of Health   Financial Resource Strain: Not on file  Food Insecurity: Not on file  Transportation Needs: Not on file  Physical Activity: Not on file  Stress: Not on file  Social Connections: Not on file  Intimate Partner Violence: Not on file      I spent *** minutes of face-to-face and non-face-to-face time with patient.  This included previsit chart review, lab review, study review, order entry, electronic health record documentation, patient education and discussion regarding above diagnoses and treatment plan answered all other questions to patient's satisfaction  Ihor Austin, [facility]  [facility] [address] [place], [place] [address]  Phone [phone] Fax [phone] Note: This document was prepared with digital dictation and possible smart phrase technology. Any transcriptional errors that result from this process are unintentional.

## 2023-06-12 DIAGNOSIS — B182 Chronic viral hepatitis C: Secondary | ICD-10-CM | POA: Diagnosis not present

## 2023-08-28 ENCOUNTER — Other Ambulatory Visit: Payer: Self-pay | Admitting: Gastroenterology

## 2023-08-28 DIAGNOSIS — K7469 Other cirrhosis of liver: Secondary | ICD-10-CM

## 2023-08-28 DIAGNOSIS — B182 Chronic viral hepatitis C: Secondary | ICD-10-CM

## 2023-08-31 ENCOUNTER — Other Ambulatory Visit: Payer: Self-pay | Admitting: Gastroenterology

## 2023-08-31 DIAGNOSIS — K7460 Unspecified cirrhosis of liver: Secondary | ICD-10-CM

## 2023-08-31 DIAGNOSIS — R768 Other specified abnormal immunological findings in serum: Secondary | ICD-10-CM

## 2023-08-31 DIAGNOSIS — D696 Thrombocytopenia, unspecified: Secondary | ICD-10-CM

## 2023-11-03 DIAGNOSIS — D696 Thrombocytopenia, unspecified: Secondary | ICD-10-CM | POA: Diagnosis not present

## 2023-11-03 DIAGNOSIS — E46 Unspecified protein-calorie malnutrition: Secondary | ICD-10-CM | POA: Diagnosis not present

## 2023-11-03 DIAGNOSIS — F17210 Nicotine dependence, cigarettes, uncomplicated: Secondary | ICD-10-CM | POA: Diagnosis not present

## 2023-11-03 DIAGNOSIS — F321 Major depressive disorder, single episode, moderate: Secondary | ICD-10-CM | POA: Diagnosis not present

## 2023-11-03 DIAGNOSIS — F5101 Primary insomnia: Secondary | ICD-10-CM | POA: Diagnosis not present

## 2023-11-03 DIAGNOSIS — F419 Anxiety disorder, unspecified: Secondary | ICD-10-CM | POA: Diagnosis not present

## 2023-11-03 DIAGNOSIS — Z0000 Encounter for general adult medical examination without abnormal findings: Secondary | ICD-10-CM | POA: Diagnosis not present

## 2023-11-03 DIAGNOSIS — Z681 Body mass index (BMI) 19 or less, adult: Secondary | ICD-10-CM | POA: Diagnosis not present

## 2023-11-12 ENCOUNTER — Telehealth: Payer: Self-pay | Admitting: Acute Care

## 2023-11-12 ENCOUNTER — Encounter: Payer: Self-pay | Admitting: Acute Care

## 2023-11-12 DIAGNOSIS — F17210 Nicotine dependence, cigarettes, uncomplicated: Secondary | ICD-10-CM

## 2023-11-12 DIAGNOSIS — Z122 Encounter for screening for malignant neoplasm of respiratory organs: Secondary | ICD-10-CM

## 2023-11-12 DIAGNOSIS — Z87891 Personal history of nicotine dependence: Secondary | ICD-10-CM

## 2023-11-12 NOTE — Telephone Encounter (Signed)
 Lung Cancer Screening Narrative/Criteria Questionnaire (Cigarette Smokers Only- No Cigars/Pipes/vapes)   Regina Smith   SDMV:12/02/2023 2:30 Kristen        [birth year]   LDCT: 12/03/2023 9:40 GI    68 y.o.   Phone: [phone]  Lung Screening Narrative (confirm age 90-77 yrs Medicare / 50-80 yrs Private pay insurance)   Insurance information:[employer]   Referring Provider:Tonya Carnford   This screening involves an initial phone call with a team member from our program. It is called a shared decision making visit. The initial meeting is required by  insurance and Medicare to make sure you understand the program. This appointment takes about 15-20 minutes to complete. You will complete the screening scan at your scheduled date/time.  This scan takes about 5-10 minutes to complete. You can eat and drink normally before and after the scan.  Criteria questions for Lung Cancer Screening:   Are you a current or former smoker? Current Age began smoking: 68yo   If you are a former smoker, what year did you quit smoking? Quit for 1 year then started back again(within 15 yrs)   To calculate your smoking history, I need an accurate estimate of how many packs of cigarettes you smoked per day and for how many years. (Not just the number of PPD you are now smoking)   Years smoking 55 x Packs per day 3/4 = Pack years 41.25   (at least 20 pack yrs)   (Make sure they understand that we need to know how much they have smoked in the past, not just the number of PPD they are smoking now)  Do you have a personal history of cancer?  No    Do you have a family history of cancer? Yes  (cancer type and and relative) father - renal  Are you coughing up blood?  No  Have you had unexplained weight loss of 15 lbs or more in the last 6 months? No  It looks like you meet all criteria.  When would be a good time for us  to schedule you for this screening?   Additional information: N/A

## 2023-12-02 ENCOUNTER — Encounter: Admitting: *Deleted

## 2023-12-02 NOTE — Progress Notes (Signed)
 No answer.  Attempted to reach pt twice.  Left message to call office

## 2023-12-03 ENCOUNTER — Other Ambulatory Visit

## 2023-12-16 ENCOUNTER — Encounter: Payer: Self-pay | Admitting: Emergency Medicine

## 2024-01-13 DIAGNOSIS — Z720 Tobacco use: Secondary | ICD-10-CM | POA: Diagnosis not present

## 2024-01-13 DIAGNOSIS — K921 Melena: Secondary | ICD-10-CM | POA: Diagnosis not present

## 2024-01-13 DIAGNOSIS — K2950 Unspecified chronic gastritis without bleeding: Secondary | ICD-10-CM | POA: Diagnosis not present

## 2024-01-13 DIAGNOSIS — R229 Localized swelling, mass and lump, unspecified: Secondary | ICD-10-CM | POA: Diagnosis not present

## 2024-01-13 DIAGNOSIS — R634 Abnormal weight loss: Secondary | ICD-10-CM | POA: Diagnosis not present

## 2024-01-21 ENCOUNTER — Other Ambulatory Visit: Payer: Self-pay | Admitting: Gastroenterology

## 2024-01-21 DIAGNOSIS — K7460 Unspecified cirrhosis of liver: Secondary | ICD-10-CM

## 2024-01-21 DIAGNOSIS — R768 Other specified abnormal immunological findings in serum: Secondary | ICD-10-CM

## 2024-01-21 DIAGNOSIS — D696 Thrombocytopenia, unspecified: Secondary | ICD-10-CM

## 2024-01-25 ENCOUNTER — Telehealth: Payer: Self-pay | Admitting: Acute Care

## 2024-01-25 ENCOUNTER — Encounter: Payer: Self-pay | Admitting: Acute Care

## 2024-01-25 NOTE — Telephone Encounter (Signed)
 Received VM regarding patient inquiry into out of pocket/co-pay expense for LDCT.  Per [facility] she is unable to see the details for the out of pocket.  She suggested the patient inquire with the imaging facility or her insurance carrier. The patient did not answer when returning the call.  Left a VM message with the phone number to imaging facility along with the CPT code for the LDCT.  Left our number to call back if she needed to speak with the lung screening team

## 2024-01-27 ENCOUNTER — Ambulatory Visit: Admitting: *Deleted

## 2024-01-27 ENCOUNTER — Encounter: Payer: Self-pay | Admitting: *Deleted

## 2024-01-27 DIAGNOSIS — F17210 Nicotine dependence, cigarettes, uncomplicated: Secondary | ICD-10-CM | POA: Diagnosis not present

## 2024-01-27 NOTE — Patient Instructions (Signed)

## 2024-01-27 NOTE — Progress Notes (Signed)
 Virtual Visit via Telephone Note  I connected with Regina Smith on 01/27/24 at  8:00 AM EDT by telephone and verified that I am speaking with the correct person using two identifiers.  Location Patient: Regina Smith Provider: Laneta Speaks, RN   I discussed the limitations, risks, security and privacy concerns of performing an evaluation and management service by telephone and the availability of in person appointments. I also discussed with the patient that there may be a patient responsible charge related to this service. The patient expressed understanding and agreed to proceed.   Shared Decision Making Visit Lung Cancer Screening Program [phone])   Eligibility: Age 68 y.o. Pack Years Smoking History Calculation 42 (# packs/per year x # years smoked) Recent History of coughing up blood  no Unexplained weight loss? yes ( >Than 15 pounds within the last 6 months ) Prior History Lung / other cancer no (Diagnosis within the last 5 years already requiring surveillance chest CT Scans). Smoking Status Current Smoker Former Smokers: Years since quit: n/a  Quit Date: n/a  Visit Components: Discussion included one or more decision making aids. yes Discussion included risk/benefits of screening. yes Discussion included potential follow up diagnostic testing for abnormal scans. yes Discussion included meaning and risk of over diagnosis. yes Discussion included meaning and risk of False Positives. yes Discussion included meaning of total radiation exposure. yes  Counseling Included: Importance of adherence to annual lung cancer LDCT screening. yes Impact of comorbidities on ability to participate in the program. yes Ability and willingness to under diagnostic treatment. yes  Smoking Cessation Counseling: Current Smokers:  Discussed importance of smoking cessation. yes Information about tobacco cessation classes and interventions provided to patient. yes Patient provided  with ticket for LDCT Scan. no Symptomatic Patient. no  Counseling(Intermediate counseling: > three minutes) 99406 Diagnosis Code: Tobacco Use Z72.0 Asymptomatic Patient yes  Counseling (Intermediate counseling: > three minutes counseling) [id number] Former Smokers:  Discussed the importance of maintaining cigarette abstinence. yes Diagnosis Code: Personal History of Nicotine Dependence. S12.108 Information about tobacco cessation classes and interventions provided to patient. Yes Patient provided with ticket for LDCT Scan. no Written Order for Lung Cancer Screening with LDCT placed in Epic. Yes (CT Chest Lung Cancer Screening Low Dose W/O CM) [id number] Z12.2-Screening of respiratory organs Z87.891-Personal history of nicotine dependence   Laneta Speaks, RN

## 2024-02-01 NOTE — Progress Notes (Signed)
 Tobacco counseling provided (Intermediate counseling: 3 1/2 minutes) [id number]

## 2024-02-04 ENCOUNTER — Ambulatory Visit
Admission: RE | Admit: 2024-02-04 | Discharge: 2024-02-04 | Disposition: A | Discharge status: Home / Self Care | Source: Ambulatory Visit | Attending: Family Medicine | Admitting: Family Medicine

## 2024-02-04 DIAGNOSIS — F17210 Nicotine dependence, cigarettes, uncomplicated: Secondary | ICD-10-CM | POA: Diagnosis not present

## 2024-02-04 DIAGNOSIS — Z87891 Personal history of nicotine dependence: Secondary | ICD-10-CM

## 2024-02-04 DIAGNOSIS — Z122 Encounter for screening for malignant neoplasm of respiratory organs: Secondary | ICD-10-CM | POA: Diagnosis not present

## 2024-02-05 DIAGNOSIS — M810 Age-related osteoporosis without current pathological fracture: Secondary | ICD-10-CM | POA: Diagnosis not present

## 2024-02-05 DIAGNOSIS — K7460 Unspecified cirrhosis of liver: Secondary | ICD-10-CM | POA: Diagnosis not present

## 2024-02-05 DIAGNOSIS — K921 Melena: Secondary | ICD-10-CM | POA: Diagnosis not present

## 2024-02-05 DIAGNOSIS — R634 Abnormal weight loss: Secondary | ICD-10-CM | POA: Diagnosis not present

## 2024-02-05 DIAGNOSIS — Z789 Other specified health status: Secondary | ICD-10-CM | POA: Diagnosis not present

## 2024-02-05 DIAGNOSIS — Z1231 Encounter for screening mammogram for malignant neoplasm of breast: Secondary | ICD-10-CM | POA: Diagnosis not present

## 2024-02-05 DIAGNOSIS — D696 Thrombocytopenia, unspecified: Secondary | ICD-10-CM | POA: Diagnosis not present

## 2024-02-12 ENCOUNTER — Other Ambulatory Visit: Payer: Self-pay | Admitting: Family Medicine

## 2024-02-12 DIAGNOSIS — N6489 Other specified disorders of breast: Secondary | ICD-10-CM

## 2024-02-12 DIAGNOSIS — N6320 Unspecified lump in the left breast, unspecified quadrant: Secondary | ICD-10-CM

## 2024-02-16 DIAGNOSIS — M810 Age-related osteoporosis without current pathological fracture: Secondary | ICD-10-CM | POA: Diagnosis not present

## 2024-02-16 DIAGNOSIS — E559 Vitamin D deficiency, unspecified: Secondary | ICD-10-CM | POA: Diagnosis not present

## 2024-02-19 ENCOUNTER — Other Ambulatory Visit: Payer: Self-pay

## 2024-02-19 DIAGNOSIS — F17210 Nicotine dependence, cigarettes, uncomplicated: Secondary | ICD-10-CM

## 2024-02-19 DIAGNOSIS — Z122 Encounter for screening for malignant neoplasm of respiratory organs: Secondary | ICD-10-CM

## 2024-02-19 DIAGNOSIS — Z87891 Personal history of nicotine dependence: Secondary | ICD-10-CM

## 2024-02-21 ENCOUNTER — Ambulatory Visit
Admission: RE | Admit: 2024-02-21 | Discharge: 2024-02-21 | Disposition: A | Discharge status: Home / Self Care | Source: Ambulatory Visit | Attending: Gastroenterology | Admitting: Gastroenterology

## 2024-02-21 DIAGNOSIS — K7460 Unspecified cirrhosis of liver: Secondary | ICD-10-CM

## 2024-02-21 DIAGNOSIS — B1920 Unspecified viral hepatitis C without hepatic coma: Secondary | ICD-10-CM | POA: Diagnosis not present

## 2024-02-21 DIAGNOSIS — D696 Thrombocytopenia, unspecified: Secondary | ICD-10-CM

## 2024-02-21 DIAGNOSIS — R768 Other specified abnormal immunological findings in serum: Secondary | ICD-10-CM

## 2024-02-21 MED ORDER — GADOPICLENOL 0.5 MMOL/ML IV SOLN
5.0000 mL | Freq: Once | INTRAVENOUS | Status: AC | PRN
  Administered 2024-02-21: 5 mL via INTRAVENOUS

## 2024-02-23 DIAGNOSIS — R92323 Mammographic fibroglandular density, bilateral breasts: Secondary | ICD-10-CM | POA: Diagnosis not present

## 2024-02-23 DIAGNOSIS — R928 Other abnormal and inconclusive findings on diagnostic imaging of breast: Secondary | ICD-10-CM | POA: Diagnosis not present

## 2024-02-23 DIAGNOSIS — N6489 Other specified disorders of breast: Secondary | ICD-10-CM | POA: Diagnosis not present

## 2024-02-29 DIAGNOSIS — D696 Thrombocytopenia, unspecified: Secondary | ICD-10-CM | POA: Diagnosis not present

## 2024-03-08 DIAGNOSIS — K5730 Diverticulosis of large intestine without perforation or abscess without bleeding: Secondary | ICD-10-CM | POA: Diagnosis not present

## 2024-03-08 DIAGNOSIS — D123 Benign neoplasm of transverse colon: Secondary | ICD-10-CM | POA: Diagnosis not present

## 2024-03-08 DIAGNOSIS — D125 Benign neoplasm of sigmoid colon: Secondary | ICD-10-CM | POA: Diagnosis not present

## 2024-03-08 DIAGNOSIS — Z1211 Encounter for screening for malignant neoplasm of colon: Secondary | ICD-10-CM | POA: Diagnosis not present

## 2024-03-08 DIAGNOSIS — K648 Other hemorrhoids: Secondary | ICD-10-CM | POA: Diagnosis not present

## 2024-03-31 NOTE — Progress Notes (Signed)
 This encounter was created in error - please disregard.
